# Patient Record
Sex: Female | Born: 1960 | Race: White | Hispanic: No | Marital: Married | Smoking: Former smoker
Health system: Southern US, Community
[De-identification: ages and names within clinical notes are randomized; demographics above are authoritative.]

## PROBLEM LIST (undated history)

## (undated) DIAGNOSIS — G2581 Restless legs syndrome: Secondary | ICD-10-CM

## (undated) DIAGNOSIS — E669 Obesity, unspecified: Secondary | ICD-10-CM

## (undated) DIAGNOSIS — D689 Coagulation defect, unspecified: Secondary | ICD-10-CM

## (undated) DIAGNOSIS — K648 Other hemorrhoids: Secondary | ICD-10-CM

## (undated) DIAGNOSIS — K5790 Diverticulosis of intestine, part unspecified, without perforation or abscess without bleeding: Secondary | ICD-10-CM

## (undated) DIAGNOSIS — I10 Essential (primary) hypertension: Secondary | ICD-10-CM

## (undated) DIAGNOSIS — F418 Other specified anxiety disorders: Secondary | ICD-10-CM

## (undated) DIAGNOSIS — K449 Diaphragmatic hernia without obstruction or gangrene: Secondary | ICD-10-CM

## (undated) DIAGNOSIS — K219 Gastro-esophageal reflux disease without esophagitis: Secondary | ICD-10-CM

## (undated) DIAGNOSIS — D649 Anemia, unspecified: Secondary | ICD-10-CM

## (undated) DIAGNOSIS — E785 Hyperlipidemia, unspecified: Secondary | ICD-10-CM

## (undated) DIAGNOSIS — F17200 Nicotine dependence, unspecified, uncomplicated: Secondary | ICD-10-CM

## (undated) HISTORY — DX: Coagulation defect, unspecified: D68.9

## (undated) HISTORY — DX: Diverticulosis of intestine, part unspecified, without perforation or abscess without bleeding: K57.90

## (undated) HISTORY — DX: Obesity, unspecified: E66.9

## (undated) HISTORY — PX: CARDIOVASCULAR STRESS TEST: SHX262

## (undated) HISTORY — DX: Nicotine dependence, unspecified, uncomplicated: F17.200

## (undated) HISTORY — DX: Gastro-esophageal reflux disease without esophagitis: K21.9

## (undated) HISTORY — DX: Hyperlipidemia, unspecified: E78.5

## (undated) HISTORY — DX: Other specified anxiety disorders: F41.8

## (undated) HISTORY — DX: Restless legs syndrome: G25.81

## (undated) HISTORY — DX: Anemia, unspecified: D64.9

## (undated) HISTORY — DX: Essential (primary) hypertension: I10

## (undated) HISTORY — DX: Other hemorrhoids: K64.8

## (undated) HISTORY — DX: Diaphragmatic hernia without obstruction or gangrene: K44.9

---

## 1985-03-29 HISTORY — PX: CHOLECYSTECTOMY: SHX55

## 1988-03-29 HISTORY — PX: PARTIAL HYSTERECTOMY: SHX80

## 1998-02-26 DIAGNOSIS — J40 Bronchitis, not specified as acute or chronic: Secondary | ICD-10-CM | POA: Insufficient documentation

## 1999-05-27 ENCOUNTER — Encounter: Payer: Self-pay | Admitting: Family Medicine

## 1999-05-27 ENCOUNTER — Encounter: Admission: RE | Admit: 1999-05-27 | Discharge: 1999-05-27 | Payer: Self-pay | Admitting: Family Medicine

## 1999-07-28 DIAGNOSIS — L509 Urticaria, unspecified: Secondary | ICD-10-CM | POA: Insufficient documentation

## 1999-11-04 ENCOUNTER — Encounter: Admission: RE | Admit: 1999-11-04 | Discharge: 1999-11-04 | Payer: Self-pay | Admitting: Family Medicine

## 1999-11-04 ENCOUNTER — Encounter: Payer: Self-pay | Admitting: Family Medicine

## 2001-01-27 DIAGNOSIS — N63 Unspecified lump in unspecified breast: Secondary | ICD-10-CM | POA: Insufficient documentation

## 2002-03-08 ENCOUNTER — Encounter: Admission: RE | Admit: 2002-03-08 | Discharge: 2002-03-08 | Payer: Self-pay | Admitting: Family Medicine

## 2002-03-08 ENCOUNTER — Encounter: Payer: Self-pay | Admitting: Family Medicine

## 2002-08-14 ENCOUNTER — Encounter: Payer: Self-pay | Admitting: Family Medicine

## 2002-08-14 ENCOUNTER — Encounter: Admission: RE | Admit: 2002-08-14 | Discharge: 2002-08-14 | Payer: Self-pay | Admitting: Family Medicine

## 2002-11-09 ENCOUNTER — Ambulatory Visit (HOSPITAL_BASED_OUTPATIENT_CLINIC_OR_DEPARTMENT_OTHER): Admission: RE | Admit: 2002-11-09 | Discharge: 2002-11-09 | Payer: Self-pay | Admitting: Orthopedic Surgery

## 2003-01-18 ENCOUNTER — Ambulatory Visit (HOSPITAL_BASED_OUTPATIENT_CLINIC_OR_DEPARTMENT_OTHER): Admission: RE | Admit: 2003-01-18 | Discharge: 2003-01-18 | Payer: Self-pay | Admitting: Orthopedic Surgery

## 2003-01-18 ENCOUNTER — Ambulatory Visit (HOSPITAL_COMMUNITY): Admission: RE | Admit: 2003-01-18 | Discharge: 2003-01-18 | Payer: Self-pay | Admitting: Orthopedic Surgery

## 2004-01-07 ENCOUNTER — Encounter: Admission: RE | Admit: 2004-01-07 | Discharge: 2004-01-07 | Payer: Self-pay | Admitting: Family Medicine

## 2004-09-25 ENCOUNTER — Other Ambulatory Visit: Admission: RE | Admit: 2004-09-25 | Discharge: 2004-09-25 | Payer: Self-pay | Admitting: Family Medicine

## 2005-02-09 ENCOUNTER — Encounter: Admission: RE | Admit: 2005-02-09 | Discharge: 2005-02-09 | Payer: Self-pay | Admitting: Family Medicine

## 2005-02-10 ENCOUNTER — Encounter: Admission: RE | Admit: 2005-02-10 | Discharge: 2005-02-10 | Payer: Self-pay | Admitting: Family Medicine

## 2005-04-09 ENCOUNTER — Encounter: Admission: RE | Admit: 2005-04-09 | Discharge: 2005-04-09 | Payer: Self-pay | Admitting: Family Medicine

## 2005-04-09 LAB — HM MAMMOGRAPHY: HM Mammogram: NEGATIVE

## 2006-01-04 ENCOUNTER — Ambulatory Visit: Payer: Self-pay | Admitting: Family Medicine

## 2006-04-13 ENCOUNTER — Ambulatory Visit: Payer: Self-pay | Admitting: Family Medicine

## 2006-08-09 ENCOUNTER — Ambulatory Visit: Payer: Self-pay | Admitting: Family Medicine

## 2006-08-29 ENCOUNTER — Ambulatory Visit: Payer: Self-pay | Admitting: Family Medicine

## 2006-12-07 ENCOUNTER — Ambulatory Visit: Payer: Self-pay | Admitting: Family Medicine

## 2007-01-03 ENCOUNTER — Ambulatory Visit: Payer: Self-pay | Admitting: Family Medicine

## 2007-06-12 ENCOUNTER — Ambulatory Visit: Payer: Self-pay | Admitting: Family Medicine

## 2007-08-07 ENCOUNTER — Ambulatory Visit: Payer: Self-pay | Admitting: Family Medicine

## 2007-10-03 ENCOUNTER — Ambulatory Visit: Payer: Self-pay | Admitting: Family Medicine

## 2007-11-27 ENCOUNTER — Ambulatory Visit: Payer: Self-pay | Admitting: Family Medicine

## 2008-01-10 ENCOUNTER — Ambulatory Visit: Payer: Self-pay | Admitting: Family Medicine

## 2008-03-29 HISTORY — PX: OTHER SURGICAL HISTORY: SHX169

## 2008-04-16 ENCOUNTER — Ambulatory Visit (HOSPITAL_COMMUNITY): Admission: RE | Admit: 2008-04-16 | Discharge: 2008-04-17 | Payer: Self-pay | Admitting: Urology

## 2008-07-04 ENCOUNTER — Ambulatory Visit: Payer: Self-pay | Admitting: Family Medicine

## 2008-07-25 ENCOUNTER — Ambulatory Visit: Payer: Self-pay | Admitting: Family Medicine

## 2008-10-04 ENCOUNTER — Other Ambulatory Visit: Admission: RE | Admit: 2008-10-04 | Discharge: 2008-10-04 | Payer: Self-pay | Admitting: Family Medicine

## 2008-10-04 ENCOUNTER — Ambulatory Visit: Payer: Self-pay | Admitting: Family Medicine

## 2009-02-28 ENCOUNTER — Ambulatory Visit: Payer: Self-pay | Admitting: Family Medicine

## 2009-03-14 ENCOUNTER — Encounter: Admission: RE | Admit: 2009-03-14 | Discharge: 2009-03-14 | Payer: Self-pay | Admitting: Family Medicine

## 2009-08-05 ENCOUNTER — Ambulatory Visit: Payer: Self-pay | Admitting: Family Medicine

## 2010-04-20 ENCOUNTER — Ambulatory Visit: Admit: 2010-04-20 | Payer: Self-pay | Admitting: Family Medicine

## 2010-06-01 ENCOUNTER — Other Ambulatory Visit (INDEPENDENT_AMBULATORY_CARE_PROVIDER_SITE_OTHER): Payer: PRIVATE HEALTH INSURANCE | Admitting: Family Medicine

## 2010-06-01 DIAGNOSIS — G5600 Carpal tunnel syndrome, unspecified upper limb: Secondary | ICD-10-CM

## 2010-06-01 DIAGNOSIS — J40 Bronchitis, not specified as acute or chronic: Secondary | ICD-10-CM

## 2010-06-01 DIAGNOSIS — E7800 Pure hypercholesterolemia, unspecified: Secondary | ICD-10-CM

## 2010-06-01 DIAGNOSIS — J069 Acute upper respiratory infection, unspecified: Secondary | ICD-10-CM

## 2010-06-01 DIAGNOSIS — E119 Type 2 diabetes mellitus without complications: Secondary | ICD-10-CM

## 2010-06-01 DIAGNOSIS — I10 Essential (primary) hypertension: Secondary | ICD-10-CM

## 2010-06-01 DIAGNOSIS — G479 Sleep disorder, unspecified: Secondary | ICD-10-CM

## 2010-06-01 DIAGNOSIS — F411 Generalized anxiety disorder: Secondary | ICD-10-CM

## 2010-06-01 DIAGNOSIS — N63 Unspecified lump in unspecified breast: Secondary | ICD-10-CM

## 2010-06-01 DIAGNOSIS — Z889 Allergy status to unspecified drugs, medicaments and biological substances status: Secondary | ICD-10-CM

## 2010-06-06 ENCOUNTER — Encounter: Payer: Self-pay | Admitting: Family Medicine

## 2010-06-18 ENCOUNTER — Ambulatory Visit (INDEPENDENT_AMBULATORY_CARE_PROVIDER_SITE_OTHER): Payer: PRIVATE HEALTH INSURANCE | Admitting: Family Medicine

## 2010-06-18 DIAGNOSIS — I959 Hypotension, unspecified: Secondary | ICD-10-CM

## 2010-06-18 DIAGNOSIS — R079 Chest pain, unspecified: Secondary | ICD-10-CM

## 2010-06-22 ENCOUNTER — Ambulatory Visit (INDEPENDENT_AMBULATORY_CARE_PROVIDER_SITE_OTHER): Payer: PRIVATE HEALTH INSURANCE

## 2010-06-22 DIAGNOSIS — Z79899 Other long term (current) drug therapy: Secondary | ICD-10-CM

## 2010-07-13 LAB — CBC
HCT: 35.3 % — ABNORMAL LOW (ref 36.0–46.0)
HCT: 41.4 % (ref 36.0–46.0)
Hemoglobin: 11.6 g/dL — ABNORMAL LOW (ref 12.0–15.0)
Hemoglobin: 12.4 g/dL (ref 12.0–15.0)
Hemoglobin: 13.8 g/dL (ref 12.0–15.0)
MCHC: 33.3 g/dL (ref 30.0–36.0)
MCHC: 33.4 g/dL (ref 30.0–36.0)
MCV: 83 fL (ref 78.0–100.0)
Platelets: 223 10*3/uL (ref 150–400)
Platelets: 246 10*3/uL (ref 150–400)
RBC: 4.21 MIL/uL (ref 3.87–5.11)
RBC: 4.98 MIL/uL (ref 3.87–5.11)
RDW: 13.3 % (ref 11.5–15.5)
WBC: 4.7 10*3/uL (ref 4.0–10.5)
WBC: 4.9 10*3/uL (ref 4.0–10.5)

## 2010-07-13 LAB — TYPE AND SCREEN

## 2010-07-13 LAB — BASIC METABOLIC PANEL
Chloride: 101 mEq/L (ref 96–112)
GFR calc non Af Amer: >60 mL/min (ref >60)
Sodium: 141 mEq/L (ref 135–145)

## 2010-07-13 LAB — GLUCOSE, CAPILLARY
Glucose-Capillary: 120 mg/dL — ABNORMAL HIGH (ref 70–99)
Glucose-Capillary: 127 mg/dL — ABNORMAL HIGH (ref 70–99)
Glucose-Capillary: 135 mg/dL — ABNORMAL HIGH (ref 70–99)

## 2010-07-13 LAB — ABO/RH: ABO/RH(D): O POS

## 2010-07-18 ENCOUNTER — Encounter: Payer: Self-pay | Admitting: Family Medicine

## 2010-08-07 ENCOUNTER — Other Ambulatory Visit: Payer: PRIVATE HEALTH INSURANCE

## 2010-08-07 DIAGNOSIS — Z79899 Other long term (current) drug therapy: Secondary | ICD-10-CM

## 2010-08-07 DIAGNOSIS — E785 Hyperlipidemia, unspecified: Secondary | ICD-10-CM

## 2010-08-07 LAB — LIPID PANEL: Cholesterol: 169 mg/dL (ref 0–200)

## 2010-08-07 LAB — COMPREHENSIVE METABOLIC PANEL
AST: 14 U/L (ref 0–37)
Albumin: 4.1 g/dL (ref 3.5–5.2)
Alkaline Phosphatase: 53 U/L (ref 39–117)
Calcium: 8.9 mg/dL (ref 8.4–10.5)
Creat: 0.56 mg/dL (ref 0.40–1.20)
Glucose, Bld: 89 mg/dL (ref 70–99)
Potassium: 4.4 mEq/L (ref 3.5–5.3)
Total Bilirubin: 0.4 mg/dL (ref 0.3–1.2)

## 2010-08-10 ENCOUNTER — Ambulatory Visit (INDEPENDENT_AMBULATORY_CARE_PROVIDER_SITE_OTHER): Payer: PRIVATE HEALTH INSURANCE | Admitting: Family Medicine

## 2010-08-10 ENCOUNTER — Encounter: Payer: Self-pay | Admitting: Family Medicine

## 2010-08-10 VITALS — BP 120/82 | HR 72 | Ht 62.0 in | Wt 171.0 lb

## 2010-08-10 DIAGNOSIS — I10 Essential (primary) hypertension: Secondary | ICD-10-CM

## 2010-08-10 DIAGNOSIS — E119 Type 2 diabetes mellitus without complications: Secondary | ICD-10-CM

## 2010-08-10 DIAGNOSIS — F411 Generalized anxiety disorder: Secondary | ICD-10-CM

## 2010-08-10 DIAGNOSIS — E7800 Pure hypercholesterolemia, unspecified: Secondary | ICD-10-CM

## 2010-08-10 DIAGNOSIS — K219 Gastro-esophageal reflux disease without esophagitis: Secondary | ICD-10-CM

## 2010-08-10 MED ORDER — PRAVASTATIN SODIUM 80 MG PO TABS: 80.0000 mg | ORAL_TABLET | Freq: Every day | ORAL | Status: DC

## 2010-08-10 MED ORDER — FLUOXETINE HCL 40 MG PO CAPS: 40.0000 mg | ORAL_CAPSULE | Freq: Every day | ORAL | Status: DC

## 2010-08-10 MED ORDER — METFORMIN HCL 500 MG PO TABS: 500.0000 mg | ORAL_TABLET | Freq: Two times a day (BID) | ORAL | Status: DC

## 2010-08-10 NOTE — Patient Instructions (Signed)
Please try and exercise at least 30 minutes daily.  Try and follow a healthy diet--no skipping meals, lots of vegetables, avoiding sweets and fatty foods, low sugar diet

## 2010-08-10 NOTE — Progress Notes (Signed)
Subjective:    Patient ID: Natasha Owens, female    DOB: 1960-06-04, 50 y.o.   MRN: [id number]  HPI Hypertension follow-up:  Blood pressures are not checked.  Has been cutting her 20mg  lisinopril into 1/4 and feeling just fine.  Denies dizziness, headaches, chest pain.  Denies side effects of medications.  Diabetes follow-up:  Blood sugars are not being checked.  Out of test strips.  Denies hypoglycemia.  Denies polydipsia and polyuria.  Last eye exam was 2 weeks ago.  Patient follows a low sugar diet and checks feet regularly without concerns.  Not getting any exercise and not being exceptionally careful with her diet.  Hyperlipidemia follow-up:  Patient isn't really watching her diet carefully.  Is on some sort of diet pill and has been losing weight.  She was changed from Crestor to Pravastatin due to cost purposes.  Compliant with medications and denies medication side effects.  Had labs done last week  GERD:  Taking the OTC Prevacid BID with good results. Denies any reflux symptoms lately.  Depression/Anxiety f/u:  Has been on 40mg  of Prozac.  Previously took Symbyax but it was too expensive.  Feels like it working well for anxiety, and perhaps too well for depression.  Feels like she can't cry--lost a patient recently.  Was sad, but unable to cry.  Father was recently diagnosed with lung cancer.  Her last patient recently passed away.  She just quit her job and plans to move up to [place] to help out her dad.  Plans to move Friday.  Will be unable to return for follow-up office visit anytime soon.  Past Medical History  Diagnosis Date  . Hypertension   . Smoker     quit 11/1998  . Obesity   . GERD (gastroesophageal reflux disease)   . Anxiety associated with depression   . Dyslipidemia   . Diabetes mellitus 9/05    adult onset    Past Surgical History  Procedure Date  . Partial hysterectomy 1990  . Cholecystectomy 1987  . Cardiovascular stress test   . Bladder surgery 03/2008   bladder tack--unsuccessful per pt     History   Social History  . Marital Status: Married    Spouse Name: N/A    Number of Children: 2  . Years of Education: N/A   Occupational History  . CNA     at [employer]   Social History Main Topics  . Smoking status: Former Smoker    Quit date: 02/26/2009  . Smokeless tobacco: Not on file  . Alcohol Use: Yes     rarely, 2 times a year maybe  . Drug Use: No  . Sexually Active: Not on file   Other Topics Concern  . Not on file   Social History Narrative  . No narrative on file    Family History  Problem Relation Age of Onset  . Diabetes Mother   . Heart disease Father     CHF  . Cancer Father     lymphoma, lung cancer   . Diabetes Sister   . Stroke Sister   . Dementia Sister   . Hypertension Brother   . Sleep apnea Brother   . Sleep apnea Brother     Current outpatient prescriptions:FLUoxetine (PROZAC) 40 MG capsule, Take 1 capsule (40 mg total) by mouth daily., Disp: 90 capsule, Rfl: 3;  lansoprazole (PREVACID) 15 MG capsule, Take 15 mg by mouth 2 (two) times daily.  , Disp: , Rfl: ;  lisinopril (PRINIVIL,ZESTRIL) 20 MG tablet, Take 5 mg by mouth daily. 1/4 tablet daily, Disp: , Rfl: ;  magnesium gluconate (MAGONATE) 500 MG tablet, Take 500 mg by mouth daily.  , Disp: , Rfl:  metFORMIN (GLUCOPHAGE) 500 MG tablet, Take 1 tablet (500 mg total) by mouth 2 (two) times daily with a meal., Disp: 180 tablet, Rfl: 3;  Potassium 99 MG TABS, Take 1 tablet by mouth daily.  , Disp: , Rfl: ;  pravastatin (PRAVACHOL) 80 MG tablet, Take 1 tablet (80 mg total) by mouth daily., Disp: 90 tablet, Rfl: 3;  DISCONTD: FLUoxetine (PROZAC) 40 MG capsule, Take 40 mg by mouth daily.  , Disp: , Rfl:  DISCONTD: lansoprazole (PREVACID) 30 MG capsule, Take 30 mg by mouth 2 (two) times daily. , Disp: , Rfl: ;  DISCONTD: metFORMIN (GLUCOPHAGE) 500 MG tablet, Take 500 mg by mouth 2 (two) times daily with a meal.  , Disp: , Rfl: ;  DISCONTD: pravastatin  (PRAVACHOL) 80 MG tablet, Take 80 mg by mouth daily.  , Disp: , Rfl: ;  DISCONTD: esomeprazole (NEXIUM) 40 MG capsule, Take 40 mg by mouth daily before breakfast.  , Disp: , Rfl:  DISCONTD: olanzapine-fluoxetine (SYMBYAX) 12-50 MG per capsule, Take 1 capsule by mouth every evening.  , Disp: , Rfl: ;  DISCONTD: olmesartan (BENICAR) 20 MG tablet, Take 20 mg by mouth daily.  , Disp: , Rfl: ;  DISCONTD: rosuvastatin (CRESTOR) 10 MG tablet, Take 10 mg by mouth daily.  , Disp: , Rfl:   Allergies  Allergen Reactions  . Codeine Other (See Comments)    spacey  . Sulfa Antibiotics Itching   Review of Systems Denies fevers, n/v/d, chest pain, palpitations, SOB, cough, URI symptoms, bladder concerns, skin concerns or rashes.    Objective:   Physical Exam BP 120/82  Pulse 72  Ht 5\' 2"  (1.575 m)  Wt 171 lb (77.565 kg)  BMI 31.28 kg/m2  LMP 03/29/1989 WDWN pleasant female in no distress Neck:  No LAD or thyromegaly Heart:  RRR no m/r/g Lungs:  CTA bilaterally Extr:  No CCE, 2+ pulses, normal sensation Abd:  Soft, Nontender, no organomegaly or masses Psych: full range of affect; normal mood, speech, affect, hygiene, grooming, good eye contact     Assessment & Plan:   1. Type II or unspecified type diabetes mellitus without mention of complication, not stated as uncontrolled  metFORMIN (GLUCOPHAGE) 500 MG tablet   Patient needs to re-start checking sugars (especially if will be unable to return for A1c in 4-6 months).  Cont proper diet and weight loss  2. Essential hypertension, benign     Well controlled on 5mg  Lisinopril.  Will call for change in Rx to lower strength when she needs it  3. Anxiety state, unspecified  FLUoxetine (PROZAC) 40 MG capsule   Overall is controlled.  The decreased ability to cry is currently tolerable.  If becomes a problem, she can decrease dose (ie. qod dosing or call for 20mg )  4. Pure hypercholesterolemia  pravastatin (PRAVACHOL) 80 MG tablet   Continue low  cholesterol diet and pravastatin.  Labs due in 6 months.  If out of town, okay to check 1 year  5. GERD (gastroesophageal reflux disease)

## 2010-08-11 NOTE — Op Note (Signed)
Natasha, Owens NO.:  [id number]   MEDICAL RECORD NO.:  [id number]          PATIENT TYPE:  AMB   LOCATION:  DAY                          FACILITY:  [facility]   PHYSICIAN:  Martina Sinner, MD DATE OF BIRTH:  1960-11-27   DATE OF PROCEDURE:  04/16/2008  DATE OF DISCHARGE:                               OPERATIVE REPORT   ASSISTANT:  Duane Boston and Delia Chimes.   DIAGNOSES:  Cystocele, stress incontinence and rectocele.   POSTOPERATIVE DIAGNOSES:  Cystocele, rectocele, apical defect, stress  incontinence.   SURGERY:  Sling cystourethropexy [facility]) plus cystocele repair,  iliococcygeus posterior vault suspension plus rectocele repair plus  graft plus cystoscopy plus perineal repair.   Ms. Natasha Owens has a significant posterior defect.  She has a mild to  moderate cystocele.  She has very mild stress incontinence.  She  consented to above procedure.  Preoperative lab tests were normal.  Extra care was taken with leg position to minimize the risk of  compartment syndrome, neuropathy and DVT.  Preoperative antibiotics was  given.   Under anesthesia the apex as well supported.  I could easily see the  dimples.  There was no question that the patient did not require a four  corner anterior graft.   I instilled approximately 25 mL of lidocaine epinephrine mixture  anteriorly and made a T-shaped anterior vaginal wall incision and  dissected the anterior vaginal wall from the pubocervical fascia  laterally.  I did not take down the lateral attachments.  I felt this  would weaken her tissues.  I made the incision in the midline a little  bit deeper underlying the urethra that would overlie the sling.   I used a double ring retractor.  I made two 1 cm incisions one  fingerbreadth above the symphysis pubis lateral to the midline.  I  dissected to urethrovesical angle at the level of symphysis pubis  bilaterally.   With the bladder empty I passed the [facility]  needles on top of along the  back of symphysis pubis parallel to the midline on the pulp of my index  finger bilaterally at the urethrovesical angle.  I cystoscoped the  patient.  There is no entrance of the bladder or urethra.  Cystoscopy  showed no indentation of the bladder with wiggling of the needles.   Then a two-layer anterior repair was done and I did a lot of  mobilization at the apex of the cystocele.  It flattened the cystocele  nicely.  I then recystoscoped the patient and was endoscopic reduction  of the cystocele.  There was no distortion of the ureteral orifices.  There were excellent jets, blue jets bilaterally.   Minimal anterior vaginal wall mucosa was trimmed bilaterally and I  closed the anterior vaginal wall with running 2-0 Vicryl or 0 Vicryl on  a CT1 needle.   She had a very large posterior defect.  It protruded and touched the  anterior wall throughout the length of the vagina but clinically I did  not think there was an enterocele.   I removed a small  triangle of perineal skin after placing an Allis clamp  on the left and right of the hymenal ring.  I made a long posterior  midline incision all the way to the vaginal cuff.  I dissected the thin  posterior vaginal wall from the underlying rectovaginal fascia and was  very happy with how well I mobilized apically and laterally and apically  as well.  I finger dissected through the pararectal fascial tissue and  could easily identify the iliococcygeus muscle cephalad and lateral to  the rectum.   With the finger in the rectum, I did a lot of inspection of her  posterior defect.  Initially I thought there was an enterocele.  I did  my usual techniques of using hemostats and opened up what looked like an  enterocele sac.  It became quite obvious that it was not an enterocele  and I had only opened up approximately one cell layer of a fascial  plane.   Again a lot of inspection was done and there was really not  a easy way  to the trapdoor effect in spite of a lot of mobilization to cover the  large apical and lateral defects.  One could not do an imbricating  repair posteriorly of any subsequence because of the size of the  defects.   I did two site defects with the thin rectovaginal fascia from her left  apical apex to bring the tissue medial to her apex in the midline and  pushing the pararectal fat posteriorly.  I then imbricated a little bit  of the pararectal fascial tissue and bowel serosa on her right side  reducing a defect on her right side.  Visually the repair looked  excellent with reduction of the posterior defect but certainly the  tissue was very thin.  I did a repeat rectal examination.  There was no  distortion of the rectum.  There was no suture in the bladder.   Using my narrow malleable, I placed an iliococcygeus 0 Vicryl cephalad  and posteriorly lateral to the rectum bilaterally.  It was a strong  suture.  Two more sutures were placed near the introitus picking up  mostly rectovaginal fascia.  The 7 x 4 soaked dermal graft was sewn in  place with a trap door at the apex.  We trimmed it near the introitus to  make it a little bit narrow.  I did use two sutures halfway down, a  total of six sutures for extra strength.   Appropriate amount of  vaginal mucosa was trimmed bilaterally with  approximately a little bit over a centimeter especially in the midline.  The posterior vaginal wall was closed with running 2-0 Vicryl on a CT1  needle.  At the level of the introitus I brought the suture into the  perineum and closed subcuticular running.  I did one perineal body 0  Vicryl suture.  The graft did trapdoor nicely at the apex.   There was excellent vaginal length.  There was excellent girth.  Bleeding was minimal and probably 150 mL or less.  She was oozing a  little bit anteriorly at the end of the closure, but I thought was  probably from the closure itself.  There is a  little bit of bubbling of  blood at the end as we packed anteriorly but it was minimal.  There was  almost no bleeding posteriorly.  No fulguration was used next to the  bowel.   Estrace vaginal pack  was firmly inserted.  Foley catheter had excellent  urine output of blue urine.  Leg position was good.   The patient will be followed as per protocol.  Hopefully this reaches  her treatment goal.  I am concerned about the size of her defect and  thinning of her posterior tissues.           ______________________________  Martina Sinner, MD  Electronically Signed     SAM/MEDQ  D:  04/16/2008  T:  04/16/2008  Job:  [id number]

## 2010-08-14 NOTE — Op Note (Signed)
NAME:  Natasha Owens, Natasha Owens                         ACCOUNT NO.:  [id number]   MEDICAL RECORD NO.:  [id number]                   PATIENT TYPE:  AMB   LOCATION:  DSC                                  FACILITY:  [facility]   PHYSICIAN:  Harvie Junior, M.D.                DATE OF BIRTH:  1960/04/10   DATE OF PROCEDURE:  01/18/2003  DATE OF DISCHARGE:                                 OPERATIVE REPORT   PREOPERATIVE DIAGNOSIS:  1. Ulnar nerve compression left arm.  2. Carpal tunnel syndrome, left arm.   POSTOPERATIVE DIAGNOSES:  1. Ulnar nerve compression left arm.  2. Carpal tunnel syndrome, left arm.   PROCEDURE:  1. Left ulnar decompression at the elbow.  2. Left carpal tunnel release.   SURGEON:  Harvie Junior, M.D.   ASSISTANTLenise Arena   ANESTHESIA:  General.   BRIEF HISTORY:  He is a 50 year old female with a long history of having had  bilateral nerve compression at the elbow and wrist.  She ultimately  underwent a right ulnar nerve decompression and carpal tunnel release and  tolerated this well; and did wonderfully postoperatively.  Because of  continued complaints of problems on the left side, she presented for this  same problem on the left side.   DESCRIPTION OF PROCEDURE:  The patient was brought to the operating room and  when after adequate anesthesia was obtained with a general anesthetic the  patient was placed supine on the operating table. The left arm was prepped  and draped in the usual sterile fashion.  Following Esmarch exsanguination  with tourniquet the blood pressure tourniquet was inflated to 250 mmHg.   Following this a curved incision was made over the midportion between the  medial epicondyle and the olecranon.  The subcutaneous tissue was taken down  to the level of the ulnar nerve.  The ulnar nerve was clearly identified and  the sheath of the ulnar nerve was opened proximally 8 cm proximal to the  medial epicondyle and distally 10 cm distal to the  elbow.  The nerve was  freed up over its entire length.  It was not transposed.  There was scar  tissue just distal to the medial epicondyle and the nerve did seem to be  most irritated at this point.   At this point, the nerve was copiously irrigated and suctioned dry.  Silver  retractors were used proximally and distally to make sure that the nerve was  adequately freed up over this length; and, in fact, it was.  At this point  the wound was closed with a combination of 3-0 Vicryl and 3-0 Monocryl pull  out sutures.  Benzoin and Steri-Strips were applied.  Marcaine 5 cc were  placed into the wound for postoperative anesthesia.   At this point attention was turned to the left hand where a curved incision  was made just ulnar to  the midline wrist crease.  The subcutaneous tissue  was taken down to the level of the volar carpal ligament which was clearly  identified and divided and care was taken to identify the median nerve  below. A Freer elevator was used to make sure the nerve was not adherent to  the ligament underneath and then a curved scissor was used to divide the  ligament proximally and distally.  A gloved finger could be put in the wound  proximally and distally at this point.  The median nerve was identified and  a small synovectomy was undertaken of the flexor tendon.  At this point the  wound was copiously irrigated and suctioned dry and closed with a  combination of interrupted and running 4-0 Vicryl sutures.  A sterile and  compression dressing was applied; and the patient was taken to the recovery  room and was noted to be in satisfactory condition.   ESTIMATED BLOOD LOSS FOR BOTH:  None.                                               Harvie Junior, M.D.    Ranae Plumber  D:  01/18/2003  T:  01/18/2003  Job:  [id number]

## 2010-08-14 NOTE — Op Note (Signed)
NAME:  Natasha Owens, Natasha Owens                         ACCOUNT NO.:  [id number]   MEDICAL RECORD NO.:  [id number]                   PATIENT TYPE:  AMB   LOCATION:  DSC                                  FACILITY:  [facility]   PHYSICIAN:  Harvie Junior, M.D.                DATE OF BIRTH:  06-22-1960   DATE OF PROCEDURE:  12/13/2002  DATE OF DISCHARGE:  11/09/2002                                 OPERATIVE REPORT   PREOPERATIVE DIAGNOSIS:  1. Carpal tunnel syndrome, right.  2. Ulnar nerve compression at the elbow, right.   POSTOPERATIVE DIAGNOSIS:  1. Carpal tunnel syndrome, right.  2. Ulnar nerve compression at the elbow, right.   OPERATION PERFORMED:  1. Right ulnar nerve decompression at the elbow.  2. Right carpal tunnel release.   SURGEON:  Harvie Junior, M.D.   ASSISTANT:  Marshia Ly, P.A.   ANESTHESIA:  General.   INDICATIONS FOR PROCEDURE:  The patient is a 50 year old female with along  history of having significant compression of her median nerve at the wrist  and her ulnar nerve at the elbow.  We tried conservative care which failed  and because of failure of conservative care, she was brought to the  operating room for release of these areas.   DESCRIPTION OF PROCEDURE:  The patient was brought to the operating room and  after adequate anesthesia was obtained with general anesthetic, the patient  was placed supine on the operating table.  The right arm was prepped and  draped in the usual sterile fashion.  Following Esmarch exsanguination of  the extremity, blood pressure tourniquet was inflated to .  Following  this, a curved incision was made over the right elbow.  Subcutaneous tissue  was dissected down to the level of the ulnar nerve which could clearly be  identified and decompressed for 10 cm proximal to the elbow to 7 cm distal  to the elbow.  The major area of compression appeared to be just distal to  the ulnar groove.  No attempt at transposition was  undertaken.  The wound  was copiously irrigated and suctioned dry.  A final check was made to make  sure the nerve was completely freed up and no tendency towards subluxation  with elbow flexion.  The skin was then closed with a combination of 2-0  Vicryl and a 3-0 Prolene pullout suture.  A sterile compressive dressing was  applied and attention was turned to the wrist, where a curved incision was  made just ulnar to the midline wrist crease.  Subcutaneous tissues were  dissected down to the level of the volar carpal ligament which was clearly  identified and divided.  A Freer elevator was used and through this, a small  rent in the ligament to make sure the nerve was not adherent below this and  the ligament was divided proximally and distally.  A gloved finger  could be  placed into the wound assuring that the nerve was freed up.  The nerve was  then identified as well as the motor branch of the median nerve and once  this was clearly identified, the wound was copiously irrigated and suctioned  dry, the wound was closed with a combination of interrupted and  running 4-0 nylon sutures.  Sterile compressive dressing was applied as well  as a volar plaster and the patient was then transferred to the recovery room  where she was noted to be in satisfactory condition.  The estimated blood  loss for this procedure was none.                                                Harvie Junior, M.D.    Ranae Plumber  D:  12/13/2002  T:  12/14/2002  Job:  [id number]

## 2010-08-26 ENCOUNTER — Other Ambulatory Visit: Payer: Self-pay | Admitting: *Deleted

## 2010-08-26 DIAGNOSIS — E7800 Pure hypercholesterolemia, unspecified: Secondary | ICD-10-CM

## 2010-08-26 DIAGNOSIS — F411 Generalized anxiety disorder: Secondary | ICD-10-CM

## 2010-08-26 MED ORDER — PRAVASTATIN SODIUM 80 MG PO TABS: 80.0000 mg | ORAL_TABLET | Freq: Every day | ORAL | Status: DC

## 2010-08-26 MED ORDER — FLUOXETINE HCL 40 MG PO CAPS: 40.0000 mg | ORAL_CAPSULE | Freq: Every day | ORAL | Status: DC

## 2010-09-21 ENCOUNTER — Other Ambulatory Visit: Payer: Self-pay | Admitting: *Deleted

## 2010-09-21 DIAGNOSIS — E119 Type 2 diabetes mellitus without complications: Secondary | ICD-10-CM

## 2010-09-21 MED ORDER — METFORMIN HCL 500 MG PO TABS: 500.0000 mg | ORAL_TABLET | Freq: Two times a day (BID) | ORAL | Status: DC

## 2010-12-09 ENCOUNTER — Other Ambulatory Visit: Payer: Self-pay | Admitting: *Deleted

## 2010-12-09 DIAGNOSIS — F329 Major depressive disorder, single episode, unspecified: Secondary | ICD-10-CM

## 2010-12-09 NOTE — Telephone Encounter (Signed)
I recommend adding Wellbutrin, rather than increasing fluoxetine. I also recommend that she try and get counseling/see a therapist while she is up there.  The meds can sometimes take a month or more to help, and she would benefit from counseling now.  If she isn't doing well regarding her depression, she won't be taking good care of herself (diet, diabetes, etc.).  Consider getting a doctor up there, if she is going remain there much longer. I am putting in for the Wellbutrin SR at the lower dose.  This is less expensive than the XL, but needs to be taken twice daily.  Second dose shouldn't be taken too late (ideally before 5-6 pm) or can cause insomnia.  The XL has less insomnia, taken just once daily, in the morning, but is more expensive (I would do 150mg  of XL, vs 100mg  BID of SR)
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
## 2010-12-10 MED ORDER — BUPROPION HCL ER (SR) 100 MG PO TB12: 100.0000 mg | ORAL_TABLET | Freq: Two times a day (BID) | ORAL | Status: DC

## 2010-12-10 NOTE — Telephone Encounter (Signed)
Called in to pharmacy bupropion SR 100mg  #60 BID, [place] [phone].

## 2010-12-16 ENCOUNTER — Telehealth: Payer: Self-pay | Admitting: *Deleted

## 2010-12-16 NOTE — Telephone Encounter (Signed)
Spoke with patient's husband, he wanted to let Dr.Knapp know that Chesni is taking the bupropion SR 100mg  BID and is having very good results so far. Also wanted to let you know that Grier's father passed away this morning. Patient will remain in  [place] for a little while longer with her mother but will return to [facility] thereafter.

## 2010-12-16 NOTE — Telephone Encounter (Signed)
noted 

## 2011-02-10 ENCOUNTER — Telehealth: Payer: Self-pay | Admitting: *Deleted

## 2011-02-10 DIAGNOSIS — F329 Major depressive disorder, single episode, unspecified: Secondary | ICD-10-CM

## 2011-02-10 DIAGNOSIS — I10 Essential (primary) hypertension: Secondary | ICD-10-CM

## 2011-02-10 MED ORDER — BUPROPION HCL ER (SR) 100 MG PO TB12: 100.0000 mg | ORAL_TABLET | Freq: Two times a day (BID) | ORAL | Status: DC

## 2011-02-10 MED ORDER — LISINOPRIL 20 MG PO TABS: 10.0000 mg | ORAL_TABLET | Freq: Every day | ORAL | Status: DC

## 2011-02-10 NOTE — Telephone Encounter (Signed)
Called in to [facility] in [place]. Lisinopril 20mg  #30 1/2 daily with 1 rf and bupropion SR 100mg  BID with 1 rf. Patient notified and they will call and schedule appt for 2 months.

## 2011-02-10 NOTE — Telephone Encounter (Signed)
Ok to refill x 2 months 

## 2011-02-10 NOTE — Telephone Encounter (Signed)
Patient's husband [name] called to let you know that Oktober is back from [place], she needs refills on 1) lisinopril 20mg  once daily and 2) bupropion SR 100mg  BID. She doesn't have health insurance any longer and they are aware that she is due to come in but wanted to know if you would refill medication until after the holidays and she will come in then as they cannot afford it right now. Thanks.

## 2011-02-24 ENCOUNTER — Other Ambulatory Visit: Payer: Self-pay | Admitting: *Deleted

## 2011-02-24 DIAGNOSIS — F329 Major depressive disorder, single episode, unspecified: Secondary | ICD-10-CM

## 2011-02-24 MED ORDER — BUPROPION HCL ER (SR) 100 MG PO TB12: 100.0000 mg | ORAL_TABLET | Freq: Two times a day (BID) | ORAL | Status: DC

## 2011-03-30 DIAGNOSIS — D649 Anemia, unspecified: Secondary | ICD-10-CM

## 2011-03-30 HISTORY — DX: Anemia, unspecified: D64.9

## 2011-06-09 ENCOUNTER — Telehealth: Payer: Self-pay | Admitting: Family Medicine

## 2011-06-09 DIAGNOSIS — F411 Generalized anxiety disorder: Secondary | ICD-10-CM

## 2011-06-09 DIAGNOSIS — F329 Major depressive disorder, single episode, unspecified: Secondary | ICD-10-CM

## 2011-06-09 DIAGNOSIS — E7800 Pure hypercholesterolemia, unspecified: Secondary | ICD-10-CM

## 2011-06-09 DIAGNOSIS — I10 Essential (primary) hypertension: Secondary | ICD-10-CM

## 2011-06-09 MED ORDER — FLUOXETINE HCL 40 MG PO CAPS: 40.0000 mg | ORAL_CAPSULE | Freq: Every day | ORAL | Status: DC

## 2011-06-09 MED ORDER — BUPROPION HCL ER (SR) 100 MG PO TB12: 100.0000 mg | ORAL_TABLET | Freq: Two times a day (BID) | ORAL | Status: DC

## 2011-06-09 MED ORDER — PRAVASTATIN SODIUM 80 MG PO TABS: 80.0000 mg | ORAL_TABLET | Freq: Every day | ORAL | Status: DC

## 2011-06-09 MED ORDER — LISINOPRIL 20 MG PO TABS: 10.0000 mg | ORAL_TABLET | Freq: Every day | ORAL | Status: DC

## 2011-06-09 NOTE — Telephone Encounter (Signed)
Looks like appt is 4/8.  Okay to refill 30 day supply of meds

## 2011-06-09 NOTE — Telephone Encounter (Signed)
Pt has appt scheduled with you first of April but will be out of meds. Needs refills on wellbutrin sr, prozac, lisinopril and pravastatin. Pt uses [facility] [address] in [place].

## 2011-06-10 ENCOUNTER — Other Ambulatory Visit: Payer: Self-pay | Admitting: *Deleted

## 2011-07-01 ENCOUNTER — Encounter: Payer: Self-pay | Admitting: Internal Medicine

## 2011-07-05 ENCOUNTER — Encounter: Payer: Self-pay | Admitting: Family Medicine

## 2011-07-05 ENCOUNTER — Ambulatory Visit (INDEPENDENT_AMBULATORY_CARE_PROVIDER_SITE_OTHER): Payer: Self-pay | Admitting: Family Medicine

## 2011-07-05 VITALS — BP 122/78 | HR 72 | Ht 62.0 in | Wt 169.0 lb

## 2011-07-05 DIAGNOSIS — E7800 Pure hypercholesterolemia, unspecified: Secondary | ICD-10-CM

## 2011-07-05 DIAGNOSIS — I10 Essential (primary) hypertension: Secondary | ICD-10-CM

## 2011-07-05 DIAGNOSIS — G2581 Restless legs syndrome: Secondary | ICD-10-CM | POA: Insufficient documentation

## 2011-07-05 DIAGNOSIS — F329 Major depressive disorder, single episode, unspecified: Secondary | ICD-10-CM | POA: Insufficient documentation

## 2011-07-05 DIAGNOSIS — Z79899 Other long term (current) drug therapy: Secondary | ICD-10-CM

## 2011-07-05 DIAGNOSIS — R82998 Other abnormal findings in urine: Secondary | ICD-10-CM

## 2011-07-05 DIAGNOSIS — E119 Type 2 diabetes mellitus without complications: Secondary | ICD-10-CM

## 2011-07-05 DIAGNOSIS — N390 Urinary tract infection, site not specified: Secondary | ICD-10-CM

## 2011-07-05 DIAGNOSIS — R8290 Unspecified abnormal findings in urine: Secondary | ICD-10-CM

## 2011-07-05 DIAGNOSIS — F411 Generalized anxiety disorder: Secondary | ICD-10-CM

## 2011-07-05 LAB — COMPREHENSIVE METABOLIC PANEL
ALT: 13 U/L (ref 0–35)
AST: 15 U/L (ref 0–37)
Albumin: 4 g/dL (ref 3.5–5.2)
BUN: 14 mg/dL (ref 6–23)
CO2: 25 mEq/L (ref 19–32)
Calcium: 9.7 mg/dL (ref 8.4–10.5)
Chloride: 101 mEq/L (ref 96–112)
Potassium: 4.6 mEq/L (ref 3.5–5.3)

## 2011-07-05 LAB — LIPID PANEL
Cholesterol: 157 mg/dL (ref 0–200)
Total CHOL/HDL Ratio: 2.8 Ratio
Triglycerides: 165 mg/dL — ABNORMAL HIGH (ref <150)

## 2011-07-05 LAB — POCT URINALYSIS DIPSTICK
Bilirubin, UA: NEGATIVE
Ketones, UA: NEGATIVE
pH, UA: 7

## 2011-07-05 LAB — TSH: TSH: 1.057 u[IU]/mL (ref 0.350–4.500)

## 2011-07-05 LAB — CBC WITH DIFFERENTIAL/PLATELET
Eosinophils Relative: 2 % (ref 0–5)
Hemoglobin: 11 g/dL — ABNORMAL LOW (ref 12.0–15.0)
Lymphocytes Relative: 15 % (ref 12–46)
Lymphs Abs: 1.2 10*3/uL (ref 0.7–4.0)
MCH: 24.3 pg — ABNORMAL LOW (ref 26.0–34.0)
MCV: 78.3 fL (ref 78.0–100.0)
Monocytes Relative: 9 % (ref 3–12)
Platelets: 448 10*3/uL — ABNORMAL HIGH (ref 150–400)
RBC: 4.52 MIL/uL (ref 3.87–5.11)
WBC: 7.9 10*3/uL (ref 4.0–10.5)

## 2011-07-05 LAB — T4: T4, Total: 9.9 ug/dL (ref 5.0–12.5)

## 2011-07-05 MED ORDER — FLUOXETINE HCL 40 MG PO CAPS: 40.0000 mg | ORAL_CAPSULE | Freq: Every day | ORAL | Status: DC

## 2011-07-05 MED ORDER — METFORMIN HCL 500 MG PO TABS
500.0000 mg | ORAL_TABLET | Freq: Two times a day (BID) | ORAL | Status: DC
Start: 2011-07-05 — End: 2012-01-17

## 2011-07-05 MED ORDER — CIPROFLOXACIN HCL 250 MG PO TABS: 250.0000 mg | ORAL_TABLET | Freq: Two times a day (BID) | ORAL | Status: DC

## 2011-07-05 MED ORDER — BUPROPION HCL ER (SR) 100 MG PO TB12: 100.0000 mg | ORAL_TABLET | Freq: Two times a day (BID) | ORAL | Status: DC

## 2011-07-05 MED ORDER — LISINOPRIL 20 MG PO TABS: 10.0000 mg | ORAL_TABLET | Freq: Every day | ORAL | Status: DC

## 2011-07-05 NOTE — Patient Instructions (Signed)
We will notify you of your lab results when back. If your cholesterol is at goal, we will send refill to pharmacy.  If electrolytes are normal, then we will call in something for your restless legs and let you know.  Consider grief counseling.  Restless Legs Syndrome Restless legs syndrome is a movement disorder. It may also be called a sensori-motor disorder.  CAUSES  No one knows what specifically causes restless legs syndrome, but it tends to run in families. It is also more common in people with low iron, in pregnancy, in people who need dialysis, and those with nerve damage (neuropathy).Some medications may make restless legs syndrome worse.Those medications include drugs to treat high blood pressure, some heart conditions, nausea, colds, allergies, and depression. SYMPTOMS Symptoms include uncomfortable sensations in the legs. These leg sensations are worse during periods of inactivity or rest. They are also worse while sitting or lying down. Individuals that have the disorder describe sensations in the legs that feel like:  Pulling.   Drawing.   Crawling.   Worming.   Boring.   Tingling.   Pins and needles.   Prickling.   Pain.  The sensations are usually accompanied by an overwhelming urge to move the legs. Sudden muscle jerks may also occur. Movement provides temporary relief from the discomfort. In rare cases, the arms may also be affected. Symptoms may interfere with going to sleep (sleep onset insomnia). Restless legs syndrome may also be related to periodic limb movement disorder (PLMD). PLMD is another more common motor disorder. It also causes interrupted sleep. The symptoms from PLMD usually occur most often when you are awake. TREATMENT  Treatment for restless legs syndrome is symptomatic. This means that the symptoms are treated.   Massage and cold compresses may provide temporary relief.   Walk, stretch, or take a cold or hot bath.   Get regular exercise and a  good night's sleep.   Avoid caffeine, alcohol, nicotine, and medications that can make it worse.   Do activities that provide mental stimulation like discussions, needlework, and video games. These may be helpful if you are not able to walk or stretch.  Some medications are effective in relieving the symptoms. However, many of these medications have side effects. Ask your caregiver about medications that may help your symptoms. Correcting iron deficiency may improve symptoms for some patients. Document Released: 03/05/2002 Document Revised: 03/04/2011 Document Reviewed: 06/11/2010 [facility] Patient Information 2012 [place], [place].

## 2011-07-05 NOTE — Progress Notes (Signed)
Chief complaint:  Patient presents for fasting med check, with complaint of malodorous urine.  HPI: Complaining of restless legs--has had in past.  Worse now than it has ever been.  Keeps her up at night, finally stops at 6 am.  Per old chart, tried Requip in 2005-2006, but pt reports it wasn't beneficial. Interested in trying another medication for restless legs.  Complaining of odor to urine, along with urgency and frequency for about a month.  Denies fevers, abdominal pain or kidney pain.  Denies dysuria.  No h/o UTI's in the past.  Diabetes follow-up:  Blood sugars at home are running 120-130.  Denies hypoglycemia (very rare, relieved by eating).  Denies polydipsia and polyuria.  Last eye exam was 1 year ago.  Patient follows a low sugar diet; doesn't check feet regularly but denies concerns.  Hypertension follow-up:  Blood pressures elsewhere had been running 145/78 when she wasn't taking her BP meds. Back on meds for about a month, not checked her BP since.  Denies dizziness, headaches, chest pain.  Denies side effects of medications.  Hyperlipidemia follow-up:  Patient is reportedly following a low-fat, low cholesterol diet.  Compliant with medications and denies medication side effects.  Depression/anxiety:  Moods have overall been controlled, but admits to "having her moments"--cries when she thinks of her dad.    GERD--doing well on prevacid.  Has recurrent symptoms if she misses a dose. Denies dysphagia.  Past Medical History  Diagnosis Date  . Hypertension   . Smoker     quit 11/1998  . Obesity   . GERD (gastroesophageal reflux disease)   . Anxiety associated with depression   . Dyslipidemia   . Diabetes mellitus 9/05    adult onset    Past Surgical History  Procedure Date  . Partial hysterectomy 1990  . Cholecystectomy 1987  . Cardiovascular stress test   . Bladder surgery 03/2008    bladder tack--unsuccessful per pt     History   Social History  . Marital Status:  Married    Spouse Name: N/A    Number of Children: 2  . Years of Education: N/A   Occupational History  . CNA     at [employer]   Social History Main Topics  . Smoking status: Former Smoker    Quit date: 02/26/2009  . Smokeless tobacco: Not on file  . Alcohol Use: Yes     rarely, 2 times a year maybe  . Drug Use: No  . Sexually Active: Not on file   Other Topics Concern  . Not on file   Social History Narrative  . No narrative on file    Family History  Problem Relation Age of Onset  . Diabetes Mother   . Heart disease Father     CHF  . Cancer Father     lymphoma, lung cancer   . Diabetes Sister   . Stroke Sister   . Dementia Sister   . Hypertension Brother   . Sleep apnea Brother   . Sleep apnea Brother    Current Outpatient Prescriptions on File Prior to Visit  Medication Sig Dispense Refill  . lansoprazole (PREVACID) 15 MG capsule Take 15 mg by mouth 2 (two) times daily.        . magnesium gluconate (MAGONATE) 500 MG tablet Take 500 mg by mouth daily.        . Potassium 99 MG TABS Take 1 tablet by mouth daily.        Marland Kitchen  pravastatin (PRAVACHOL) 80 MG tablet Take 1 tablet (80 mg total) by mouth daily.  30 tablet  0  . DISCONTD: buPROPion (WELLBUTRIN SR) 100 MG 12 hr tablet Take 1 tablet (100 mg total) by mouth 2 (two) times daily. Take evening dose no later than 5 pm  60 tablet  0  . DISCONTD: FLUoxetine (PROZAC) 40 MG capsule Take 1 capsule (40 mg total) by mouth daily.  30 capsule  0  . DISCONTD: lisinopril (PRINIVIL,ZESTRIL) 20 MG tablet Take 0.5 tablets (10 mg total) by mouth daily.  30 tablet  0  . DISCONTD: metFORMIN (GLUCOPHAGE) 500 MG tablet Take 1 tablet (500 mg total) by mouth 2 (two) times daily with a meal.  180 tablet  3    Allergies  Allergen Reactions  . Codeine Other (See Comments)    spacey  . Sulfa Antibiotics Itching   ROS:  Denies fevers, headaches, dizziness, chest pain, palpitations, shortness of breath, GI complaints, skin  rashes/lesions.  Had URI that lasted for a few months--finally resolved.  Denies flank pain, hematuria.  PHYSICAL EXAM: BP 122/78  Pulse 72  Ht 5\' 2"  (1.575 m)  Wt 169 lb (76.658 kg)  BMI 30.91 kg/m2  LMP 03/29/1989 Well developed, pleasant, overweight female in no distress Neck: no lymphadenopathy or mass Heart: regular rate and rhythm Lungs: clear bilaterally Abdomen: Mild suprapubic tenderness, no organomegaly or mass Back: no spinal or CVA tenderness Extremities: no clubbing, cyanosis or edema, 2+ pulses, normal sensation.  Normal diabetic foot exam Skin: no rashes/lesions Psych: became tearful in discussing her dad.  Full range of affect.  Normal hygiene and grooming.  Lab Results  Component Value Date   HGBA1C 5.3 07/05/2011   ASSESSMENT/PLAN: 1. Type II or unspecified type diabetes mellitus without mention of complication, not stated as uncontrolled  POCT HgB A1C, metFORMIN (GLUCOPHAGE) 500 MG tablet, FREE THYROXINE INDEX, CBC with Differential, RPR, Comprehensive metabolic panel, TSH, T3 uptake, T4, free, T4, Lipid panel  2. Abnormal urine odor  POCT Urinalysis Dipstick  3. Essential hypertension, benign    4. Pure hypercholesterolemia    5. Type II or unspecified type diabetes mellitus without mention of complication, not stated as uncontrolled  POCT HgB A1C, metFORMIN (GLUCOPHAGE) 500 MG tablet, FREE THYROXINE INDEX, CBC with Differential, RPR, Comprehensive metabolic panel, TSH, T3 uptake, T4, free, T4, Lipid panel   Patient needs to re-start checking sugars (especially if will be unable to return for A1c in 4-6 months).  Cont proper diet and weight loss  6. Unspecified essential hypertension  lisinopril (PRINIVIL,ZESTRIL) 20 MG tablet  7. Anxiety state, unspecified  FLUoxetine (PROZAC) 40 MG capsule   Overall is controlled.  The decreased ability to cry is currently tolerable.  If becomes a problem, she can decrease dose (ie. qod dosing or call for 20mg )  8. Depressive  disorder, not elsewhere classified  buPROPion (WELLBUTRIN SR) 100 MG 12 hr tablet  9. Urinary tract infection, site not specified  ciprofloxacin (CIPRO) 250 MG tablet  10. Restless leg syndrome     Patient is without insurance--check EP3 panel.  Wait on pravastatin refill until labs back.  RLS--consider neurontin vs clonazepam.  CALL SOMETHING FOR HER AFTER LABS BACK Drink plenty of fluids.  Consider grief counseling  Addendum--TG 165 otherwise lipids okay.  Refill pravastatin x 6 months. Mild anemia--need to look into why, appears likely to be iron deficient.  Start iron RLS--start klonopin 0.5 mg qHS

## 2011-07-06 ENCOUNTER — Encounter: Payer: Self-pay | Admitting: Family Medicine

## 2011-07-06 LAB — RPR

## 2011-07-06 MED ORDER — PRAVASTATIN SODIUM 80 MG PO TABS: 80.0000 mg | ORAL_TABLET | Freq: Every day | ORAL | Status: DC

## 2011-07-08 ENCOUNTER — Other Ambulatory Visit: Payer: Self-pay | Admitting: *Deleted

## 2011-07-08 MED ORDER — CLONAZEPAM 0.5 MG PO TABS: 0.5000 mg | ORAL_TABLET | Freq: Every day | ORAL | Status: DC

## 2011-08-02 ENCOUNTER — Other Ambulatory Visit: Payer: Self-pay | Admitting: *Deleted

## 2011-08-02 ENCOUNTER — Telehealth: Payer: Self-pay | Admitting: *Deleted

## 2011-08-02 MED ORDER — CLONAZEPAM 0.5 MG PO TABS: 0.5000 mg | ORAL_TABLET | Freq: Every day | ORAL | Status: DC

## 2011-08-02 NOTE — Telephone Encounter (Signed)
Okay for #30 with 2 refills 

## 2011-08-02 NOTE — Telephone Encounter (Signed)
Called and stated that clonazepam .5mg  qhs is working very well for her RLS symptoms. Would like refill called into [facility] in [place].

## 2011-08-03 ENCOUNTER — Other Ambulatory Visit (INDEPENDENT_AMBULATORY_CARE_PROVIDER_SITE_OTHER): Payer: Self-pay

## 2011-08-03 DIAGNOSIS — E611 Iron deficiency: Secondary | ICD-10-CM

## 2011-08-03 DIAGNOSIS — D649 Anemia, unspecified: Secondary | ICD-10-CM

## 2011-08-03 LAB — POC HEMOCCULT BLD/STL (HOME/3-CARD/SCREEN)
Card #3 Fecal Occult Blood, POC: POSITIVE
Fecal Occult Blood, POC: NEGATIVE

## 2011-08-04 DIAGNOSIS — D649 Anemia, unspecified: Secondary | ICD-10-CM | POA: Insufficient documentation

## 2011-11-26 ENCOUNTER — Telehealth: Payer: Self-pay | Admitting: Family Medicine

## 2011-11-26 MED ORDER — CLONAZEPAM 0.5 MG PO TABS: 0.5000 mg | ORAL_TABLET | Freq: Every day | ORAL | Status: DC

## 2011-11-26 NOTE — Telephone Encounter (Signed)
Okay to refill--please call in #30 with 2 refills (for restless leg syndrome) and document. Thanks

## 2011-11-26 NOTE — Telephone Encounter (Signed)
I called the patients medication to her pharmacy per Dr. Lynelle Doctor.CLS

## 2011-12-08 ENCOUNTER — Telehealth: Payer: Self-pay | Admitting: *Deleted

## 2011-12-08 NOTE — Telephone Encounter (Signed)
Patient's husband, [name] called and stated that Clariece has been taking 0.5mg  qhs of clonazepam for RLS. Hasn't been working quite as well lately. Last night she took 1mg  and felt much better today. Is this okay for her to take nightly? And if so can you call in a new rx? [facility] [address].

## 2011-12-08 NOTE — Telephone Encounter (Signed)
It looks like the 0.5 dose was started after her last visit, and appears to have worked well for a while at that dose.  Have her look back to the AVS to look at other contributors to RLS, rather than jumping to higher dose regularly.  I'd rather her stay on 0.5mg , and use two tabs only as needed.  If chronically requires 2 tabs, and no longer getting good benefit from 1, then we can discuss increasing dose.  This is a controlled substance, and might require visit to discuss if planning to change dose longterm

## 2011-12-08 NOTE — Telephone Encounter (Signed)
Will call for appt when they need it.

## 2012-01-13 ENCOUNTER — Ambulatory Visit (INDEPENDENT_AMBULATORY_CARE_PROVIDER_SITE_OTHER): Payer: BC Managed Care – PPO | Admitting: Family Medicine

## 2012-01-13 ENCOUNTER — Encounter: Payer: Self-pay | Admitting: Family Medicine

## 2012-01-13 VITALS — BP 122/84 | HR 80 | Ht 62.0 in | Wt 160.0 lb

## 2012-01-13 DIAGNOSIS — I10 Essential (primary) hypertension: Secondary | ICD-10-CM

## 2012-01-13 DIAGNOSIS — G2581 Restless legs syndrome: Secondary | ICD-10-CM

## 2012-01-13 DIAGNOSIS — R5381 Other malaise: Secondary | ICD-10-CM

## 2012-01-13 DIAGNOSIS — R5383 Other fatigue: Secondary | ICD-10-CM

## 2012-01-13 DIAGNOSIS — R82998 Other abnormal findings in urine: Secondary | ICD-10-CM

## 2012-01-13 DIAGNOSIS — R8290 Unspecified abnormal findings in urine: Secondary | ICD-10-CM

## 2012-01-13 DIAGNOSIS — D649 Anemia, unspecified: Secondary | ICD-10-CM

## 2012-01-13 DIAGNOSIS — Z79899 Other long term (current) drug therapy: Secondary | ICD-10-CM

## 2012-01-13 DIAGNOSIS — E119 Type 2 diabetes mellitus without complications: Secondary | ICD-10-CM

## 2012-01-13 LAB — CBC WITH DIFFERENTIAL/PLATELET
Eosinophils Absolute: 0.2 10*3/uL (ref 0.0–0.7)
Hemoglobin: 6.7 g/dL — CL (ref 12.0–15.0)
Lymphocytes Relative: 20 % (ref 12–46)
Lymphs Abs: 1.1 10*3/uL (ref 0.7–4.0)
MCH: 18.9 pg — ABNORMAL LOW (ref 26.0–34.0)
MCV: 64.5 fL — ABNORMAL LOW (ref 78.0–100.0)
Monocytes Relative: 16 % — ABNORMAL HIGH (ref 3–12)
Neutrophils Relative %: 58 % (ref 43–77)
Platelets: 456 10*3/uL — ABNORMAL HIGH (ref 150–400)
RBC: 3.55 MIL/uL — ABNORMAL LOW (ref 3.87–5.11)
WBC: 5.3 10*3/uL (ref 4.0–10.5)

## 2012-01-13 NOTE — Progress Notes (Signed)
Chief Complaint  Patient presents with  . Advice Only    having a hard time breathing, SOB upon exertion. Stopped taking bp medication in the month of October. Is not sleeping at all, maybe 2-3 hrs a night.   HPI:  Having some shortness of breath with exertion x 5 months.  Feels like her "heart is pounding out of my chest" along with shortness of breath.  Occurs even with just talking, and just her regular daily activities at work.  No regular exercise outside of work.  Stopped taking the lisinopril about a month ago to see if that would help with her symptoms, but didn't note any improvement.  BP's have been "good" when checked at work, but doesn't recall the numbers.   She was found to be anemic back in April.  She also had +hemoccult. It was recommended that she start iron, and get a colonoscopy (when she got insurance).  She couldn't tolerate the iron due to constipation, so didn't take it.  She has had insurance, but hasn't followed up for a med check on her chronic medical problems.  The reason for scheduling this visit today was for RLS symptoms worsening, and lack of sleep.  RLS:  Started on Klonopin 0.5mg  in April.  Phone messages from May reported it was working well, and only recently (phone call in September) stated that the 1mg  worked better.  One night she seemed to do better on the 1mg  dose, but she reports it didn't seem to help on other nights. She doesn't think her husband left accurate messages (she makes him call with her concerns, for refills, etc as she cannot call while working).  She has tried a lot to try and help with her leg discomfort. She has taken extra potassium, lots of tylenol at bedtime. Admits to taking FIVE tylenol at bedtime.  She stopped the klonopin because it wasn't working.  DM--she stopped her metformin because her sugars were all <100.  Sugars remained <115, but her boss told her she looked yellow, so she went back on the metformin (?somehow thought they were  related). She thinks her color has gotten a little better since restarting med..  Past Medical History  Diagnosis Date  . Hypertension   . Smoker     quit 11/1998  . Obesity   . GERD (gastroesophageal reflux disease)   . Anxiety associated with depression   . Dyslipidemia   . Diabetes mellitus 9/05    adult onset   Past Surgical History  Procedure Date  . Partial hysterectomy 1990  . Cholecystectomy 1987  . Cardiovascular stress test   . Bladder surgery 03/2008    bladder tack--unsuccessful per pt    History   Social History  . Marital Status: Married    Spouse Name: N/A    Number of Children: 2  . Years of Education: N/A   Occupational History  . CNA     at [employer]   Social History Main Topics  . Smoking status: Former Smoker    Quit date: 02/26/2009  . Smokeless tobacco: Not on file  . Alcohol Use: Yes     rarely, 2 times a year maybe  . Drug Use: No  . Sexually Active: Not on file   Other Topics Concern  . Not on file   Social History Narrative  . No narrative on file   Current outpatient prescriptions:acetaminophen (TYLENOL) 500 MG tablet, Take 2,000-2,500 mg by mouth at bedtime., Disp: , Rfl: ;  aspirin 325 MG tablet, Take 325 mg by mouth daily., Disp: , Rfl: ;  b complex vitamins tablet, Take 1 tablet by mouth daily., Disp: , Rfl: ;  buPROPion (WELLBUTRIN SR) 100 MG 12 hr tablet, Take 1 tablet (100 mg total) by mouth 2 (two) times daily. Take evening dose no later than 5 pm, Disp: 60 tablet, Rfl: 5 cholecalciferol (VITAMIN D) 1000 UNITS tablet, Take 1,000 Units by mouth daily., Disp: , Rfl: ;  famotidine (PEPCID) 20 MG tablet, Take 20 mg by mouth 2 (two) times daily., Disp: , Rfl: ;  FLUoxetine (PROZAC) 40 MG capsule, Take 1 capsule (40 mg total) by mouth daily., Disp: 30 capsule, Rfl: 5;  magnesium oxide (MAG-OX) 400 MG tablet, Take 400 mg by mouth 2 (two) times daily., Disp: , Rfl:  metFORMIN (GLUCOPHAGE) 500 MG tablet, Take 1 tablet (500 mg total) by  mouth 2 (two) times daily with a meal., Disp: 180 tablet, Rfl: 1;  Multiple Vitamins-Minerals (MULTIVITAMIN WITH MINERALS) tablet, Take 1 tablet by mouth daily., Disp: , Rfl: ;  Potassium 99 MG TABS, Take 2 tablets by mouth daily. , Disp: , Rfl: ;  pravastatin (PRAVACHOL) 80 MG tablet, Take 1 tablet (80 mg total) by mouth daily., Disp: 90 tablet, Rfl: 1 vitamin E 400 UNIT capsule, Take 400 Units by mouth daily., Disp: , Rfl: ;  clonazePAM (KLONOPIN) 0.5 MG tablet, Take 1 tablet (0.5 mg total) by mouth at bedtime., Disp: 30 tablet, Rfl: 2;  lisinopril (PRINIVIL,ZESTRIL) 20 MG tablet, Take 0.5 tablets (10 mg total) by mouth daily., Disp: 30 tablet, Rfl: 5;  Probiotic Product (TRUBIOTICS) CAPS, Take 1 capsule by mouth daily., Disp: , Rfl:   Allergies  Allergen Reactions  . Codeine Other (See Comments)    spacey  . Sulfa Antibiotics Itching   ROS: denies fevers, URI symptoms, cough, skin rash.  Denies nausea, vomiting, bloody or black stools, dysuria or frequency, but does report foul odor to her urine.  See HPI.  PHYSICAL EXAM: BP 122/84  Pulse 80  Ht 5\' 2"  (1.575 m)  Wt 160 lb (72.576 kg)  BMI 29.26 kg/m2  LMP 03/29/1989 Pale-appearing female, in no distress, accompanied by her husband HEENT: conjunctiva clear, sclera anicteric.  OP clear, mucus membranes moist Neck: no lymphadenopathy, thyromegaly or mass Heart: regular rate and rhythm Lungs: clear bilaterally Back: no CVA tenderness Abdomen: soft, nontender, no organomegaly or mass Extremities: no edema Skin: no rash, pale, no jaundice Psych: normal mood.  Somewhat irritable, upset about her lack of sleep  ASSESSMENT/PLAN:  1. Restless leg syndrome    2. Anemia, unspecified  CBC with Differential, Iron, Ferritin, Vitamin B12  3. Other malaise and fatigue  Comprehensive metabolic panel, CBC with Differential, Vitamin D 25 hydroxy  4. Type II or unspecified type diabetes mellitus without mention of complication, not stated as  uncontrolled  Hemoglobin A1c, Microalbumin / creatinine urine ratio  5. Essential hypertension, benign  Comprehensive metabolic panel, Urinalysis Dipstick  6. Encounter for long-term (current) use of other medications  Comprehensive metabolic panel  7. Abnormal urine odor  Urinalysis Dipstick  8. Anemia     We reviewed her labs from April, the fact that she had anemia with heme+ stool that hasn't been worked up, and likely significantly worse in the last 6 months.  Anemia would account for her shortness of breath, as well as worsening RLS symptoms.  She is past due for med check, but is nonfasting today.  Will check all labs except for  lipids--will need to return fasting for med check.  Discussed treatments of RLS, including klonopin, requip, mirapex, neurontin, but that likely these won't be effective currently if there are other contributing factors.  Check labs today.  Will need referral to GI for anemia, colonoscopy, and is willing to do so now. Will notify of lab results and arrange referral and f/u

## 2012-01-14 ENCOUNTER — Telehealth: Payer: Self-pay | Admitting: *Deleted

## 2012-01-14 ENCOUNTER — Encounter: Payer: Self-pay | Admitting: Internal Medicine

## 2012-01-14 ENCOUNTER — Encounter: Payer: Self-pay | Admitting: *Deleted

## 2012-01-14 ENCOUNTER — Ambulatory Visit (HOSPITAL_COMMUNITY)
Admission: RE | Admit: 2012-01-14 | Discharge: 2012-01-14 | Disposition: A | Discharge status: Home / Self Care | Payer: BC Managed Care – PPO | Source: Ambulatory Visit | Attending: Family Medicine | Admitting: Family Medicine

## 2012-01-14 DIAGNOSIS — D649 Anemia, unspecified: Secondary | ICD-10-CM | POA: Insufficient documentation

## 2012-01-14 LAB — VITAMIN B12: Vitamin B-12: 575 pg/mL (ref 211–911)

## 2012-01-14 LAB — MICROALBUMIN / CREATININE URINE RATIO: Microalb, Ur: 0.6 mg/dL (ref 0.00–1.89)

## 2012-01-14 LAB — COMPREHENSIVE METABOLIC PANEL
Albumin: 4.3 g/dL (ref 3.5–5.2)
Alkaline Phosphatase: 54 U/L (ref 39–117)
BUN: 16 mg/dL (ref 6–23)
CO2: 25 mEq/L (ref 19–32)
Glucose, Bld: 85 mg/dL (ref 70–99)
Total Bilirubin: 0.2 mg/dL — ABNORMAL LOW (ref 0.3–1.2)

## 2012-01-14 LAB — FERRITIN: Ferritin: 3 ng/mL — ABNORMAL LOW (ref 10–291)

## 2012-01-14 LAB — HEMOGLOBIN A1C: Hgb A1c MFr Bld: 5.6 % (ref <5.7)

## 2012-01-14 LAB — IRON: Iron: 11 ug/dL — ABNORMAL LOW (ref 42–145)

## 2012-01-14 LAB — ABO/RH: ABO/RH(D): O POS

## 2012-01-14 LAB — VITAMIN D 25 HYDROXY (VIT D DEFICIENCY, FRACTURES): Vit D, 25-Hydroxy: 46 ng/mL (ref 30–89)

## 2012-01-14 NOTE — Telephone Encounter (Signed)
Ladonna Snide from [facility] [phone] called yesterday to add this patient in for blood. she called back this morning, before I could call her back. I have informed her that we don't transfusion patients that aren't established with one of our physicians. I spoke with both the infusion room charge nurse and Trey Paula regarding this policy.    JMW

## 2012-01-14 NOTE — Progress Notes (Signed)
Quick Note:  Set her up for a type and cross for 2 units of packed cells and refer to GI. Make sure you get a hold of her today and she gets the blood today ______

## 2012-01-14 NOTE — Telephone Encounter (Signed)
To add this last note.. I gave the nurse from [facility] to try medical day or short stay for the patient to receive blood. JMW

## 2012-01-15 LAB — TYPE AND SCREEN: Unit division: 0

## 2012-01-17 ENCOUNTER — Ambulatory Visit (INDEPENDENT_AMBULATORY_CARE_PROVIDER_SITE_OTHER): Payer: BC Managed Care – PPO | Admitting: Family Medicine

## 2012-01-17 ENCOUNTER — Encounter: Payer: Self-pay | Admitting: Family Medicine

## 2012-01-17 VITALS — BP 128/78 | HR 68 | Ht 62.0 in | Wt 160.0 lb

## 2012-01-17 DIAGNOSIS — E119 Type 2 diabetes mellitus without complications: Secondary | ICD-10-CM

## 2012-01-17 DIAGNOSIS — K219 Gastro-esophageal reflux disease without esophagitis: Secondary | ICD-10-CM

## 2012-01-17 DIAGNOSIS — E7800 Pure hypercholesterolemia, unspecified: Secondary | ICD-10-CM

## 2012-01-17 DIAGNOSIS — D509 Iron deficiency anemia, unspecified: Secondary | ICD-10-CM | POA: Insufficient documentation

## 2012-01-17 DIAGNOSIS — I10 Essential (primary) hypertension: Secondary | ICD-10-CM

## 2012-01-17 LAB — CBC WITH DIFFERENTIAL/PLATELET
Basophils Absolute: 0.1 10*3/uL (ref 0.0–0.1)
Basophils Relative: 2 % — ABNORMAL HIGH (ref 0–1)
Hemoglobin: 9.7 g/dL — ABNORMAL LOW (ref 12.0–15.0)
Lymphocytes Relative: 17 % (ref 12–46)
MCHC: 30.2 g/dL (ref 30.0–36.0)
Monocytes Relative: 12 % (ref 3–12)
Neutro Abs: 3.9 10*3/uL (ref 1.7–7.7)
Neutrophils Relative %: 65 % (ref 43–77)
RDW: 20.9 % — ABNORMAL HIGH (ref 11.5–15.5)
WBC: 5.9 10*3/uL (ref 4.0–10.5)

## 2012-01-17 MED ORDER — ESOMEPRAZOLE MAGNESIUM 40 MG PO CPDR: 40.0000 mg | DELAYED_RELEASE_CAPSULE | Freq: Every day | ORAL | Status: DC

## 2012-01-17 NOTE — Patient Instructions (Addendum)
Keep your upcoming appointment with the GI doctor.  Stop aspirin.  If, in future, it is felt okay to resume (after GI evaluation), then I only recommend taking 81 mg daily, not 325 mg.  Stop Pepcid.  Take nexium once daily instead, until evaluated by GI doctors.  Diabetes--trial of stopping the Metformin.  Restart if sugars consistently >120-130

## 2012-01-17 NOTE — Progress Notes (Signed)
Chief Complaint  Patient presents with  . Anemia    follow up.   Patient presents for f/u on her anemia.  She was found to have Hg 6.7 on labs last week, and had a transfusion 3 days ago. She is also here to follow up on all other lab results.  Overall she is feeling a little better, she felt a little better yesterday compared to today, feeling tired today.  She worked over the weekend.  Still feeling fatigued, but didn't sleep well from RLS, even with Klonopin. Denies dizziness; shortness of breath improved.  Denies chest pain.  DM--sugars all around 110 or less.   GERD--denies any heartburn as long as she takes her pepcid. Previously took Nexium (and omeprazole, and prevacid) but had issues with insurance coverage. She denies heartburn, chest pain, abdominal pain, dysphagia. She denies nausea, vomiting, bowel changes, abdominal pain.  Denies any melena, hematochezia, coffee ground emesis or other GI complaints. HTN--denies dizziness, headaches, edema  Past Medical History  Diagnosis Date  . Hypertension   . Smoker     quit 11/1998  . Obesity   . GERD (gastroesophageal reflux disease)   . Anxiety associated with depression   . Dyslipidemia   . Diabetes mellitus 9/05    adult onset   Past Surgical History  Procedure Date  . Partial hysterectomy 1990  . Cholecystectomy 1987  . Cardiovascular stress test   . Bladder surgery 03/2008    bladder tack--unsuccessful per pt    History   Social History  . Marital Status: Married    Spouse Name: N/A    Number of Children: 2  . Years of Education: N/A   Occupational History  . CNA     at [employer]   Social History Main Topics  . Smoking status: Former Smoker    Quit date: 02/26/2009  . Smokeless tobacco: Not on file  . Alcohol Use: Yes     rarely, 2 times a year maybe  . Drug Use: No  . Sexually Active: Not on file   Other Topics Concern  . Not on file   Social History Narrative  . No narrative on file   Current  Outpatient Prescriptions on File Prior to Visit  Medication Sig Dispense Refill  . acetaminophen (TYLENOL) 500 MG tablet Take 2,000-2,500 mg by mouth at bedtime.      Marland Kitchen b complex vitamins tablet Take 1 tablet by mouth daily.      Marland Kitchen buPROPion (WELLBUTRIN SR) 100 MG 12 hr tablet Take 1 tablet (100 mg total) by mouth 2 (two) times daily. Take evening dose no later than 5 pm  60 tablet  5  . cholecalciferol (VITAMIN D) 1000 UNITS tablet Take 1,000 Units by mouth daily.      . clonazePAM (KLONOPIN) 0.5 MG tablet Take 1 tablet (0.5 mg total) by mouth at bedtime.  30 tablet  2  . FLUoxetine (PROZAC) 40 MG capsule Take 1 capsule (40 mg total) by mouth daily.  30 capsule  5  . lisinopril (PRINIVIL,ZESTRIL) 20 MG tablet Take 0.5 tablets (10 mg total) by mouth daily.  30 tablet  5  . magnesium oxide (MAG-OX) 400 MG tablet Take 400 mg by mouth 2 (two) times daily.      . Multiple Vitamins-Minerals (MULTIVITAMIN WITH MINERALS) tablet Take 1 tablet by mouth daily.      . Potassium 99 MG TABS Take 2 tablets by mouth daily.       . pravastatin (PRAVACHOL)  80 MG tablet Take 1 tablet (80 mg total) by mouth daily.  90 tablet  1  . Probiotic Product (TRUBIOTICS) CAPS Take 1 capsule by mouth daily.      . vitamin E 400 UNIT capsule Take 400 Units by mouth daily.       Allergies  Allergen Reactions  . Codeine Other (See Comments)    spacey  . Sulfa Antibiotics Itching   ROS:  Denies fevers, URI symptoms, cough, shortness of breath, GI complaints. Still has slight odor to urine; denies hematuria, urgency, frequency, incontinence or dysuria.  Denies vaginal bleeding, discharge.  Denies bleeding/bruising, skin rash  PHYSICAL EXAM: BP 128/78  Pulse 68  Ht 5\' 2"  (1.575 m)  Wt 160 lb (72.576 kg)  BMI 29.26 kg/m2  LMP 03/29/1989 Well developed, pleasant, pale-appearing female, in no distress.  Color is a little better (less pale) than last week, but still pale. Neck: no lymphadenopathy, thyromegaly, bruit,  mass Heart: regular rate and rhythm without murmur Lungs: clear bilaterally Abdomen: normal bowel sounds.  Nontender, no hepatosplenomegaly or mass Extremities: no edema Skin: no rash, bruising.  Pale Psych: normal mood, affect, hygiene and grooming Neuro: alert and oriented, normal gait, cranial nerves grossly intact  Lab Results  Component Value Date   WBC 5.3 01/13/2012   HGB 6.7* 01/13/2012   HCT 22.9* 01/13/2012   MCV 64.5* 01/13/2012   PLT 456* 01/13/2012  iron 11 Ferritin 3 Vitamin D and B12 levels were normal    Chemistry      Component Value Date/Time   NA 142 01/13/2012 1711   K 4.5 01/13/2012 1711   CL 109 01/13/2012 1711   CO2 25 01/13/2012 1711   BUN 16 01/13/2012 1711   CREATININE 0.67 01/13/2012 1711   CREATININE 0.68 04/16/2008 0700      Component Value Date/Time   CALCIUM 9.1 01/13/2012 1711   ALKPHOS 54 01/13/2012 1711   AST 14 01/13/2012 1711   ALT 13 01/13/2012 1711   BILITOT 0.2* 01/13/2012 1711     Lab Results  Component Value Date   HGBA1C 5.6 01/13/2012   Lab Results  Component Value Date   CHOL 157 07/05/2011   HDL 57 07/05/2011   LDLCALC 67 07/05/2011   TRIG 165* 07/05/2011   CHOLHDL 2.8 07/05/2011   ASSESSMENT/PLAN:  1. Iron deficiency anemia, unspecified  CBC with Differential   s/p transfusion 3 days ago; Check CBC today.  ? etiology, most likely GI losses. Asymptomatic re: any bleeding. GI appt next week  2. Type II or unspecified type diabetes mellitus without mention of complication, not stated as uncontrolled     well controlled.  trial off Metformin. Continue to check sugars, and restart metformin if sugars consistently >120-130  3. Pure hypercholesterolemia     well controlled.  continue pravastatin  4. GERD (gastroesophageal reflux disease)  esomeprazole (NEXIUM) 40 MG capsule   asymptomatic on OTC Pepcid.  Given iron deficiency anemia, change to Nexium while awaiting GI eval.  Samples given (issues with insurance coverage in past).    5. Essential hypertension, benign     well controlled   Anemia--s/p transfusion, but I'm sure still anemic.  GI consult next week.   Stop aspirin.  If, in future, it is felt okay to resume (after GI evaluation), then I only recommend taking 81 mg daily, not 325 mg as she has been taking.  GERD--Stop Pepcid.  Take nexium once daily, until evaluated by GI doctors. DM--trial of stopping the Metformin.  Restart if sugars consistently >120-130 HTN--controlled Lipids--controlled

## 2012-01-18 NOTE — Progress Notes (Signed)
Pt informed

## 2012-01-20 ENCOUNTER — Ambulatory Visit: Payer: Self-pay | Admitting: Family Medicine

## 2012-01-24 ENCOUNTER — Encounter: Payer: Self-pay | Admitting: Internal Medicine

## 2012-01-24 ENCOUNTER — Ambulatory Visit (INDEPENDENT_AMBULATORY_CARE_PROVIDER_SITE_OTHER): Payer: BC Managed Care – PPO | Admitting: Internal Medicine

## 2012-01-24 VITALS — BP 120/82 | HR 80 | Ht 62.0 in | Wt 163.0 lb

## 2012-01-24 DIAGNOSIS — K219 Gastro-esophageal reflux disease without esophagitis: Secondary | ICD-10-CM

## 2012-01-24 DIAGNOSIS — R634 Abnormal weight loss: Secondary | ICD-10-CM

## 2012-01-24 DIAGNOSIS — K5900 Constipation, unspecified: Secondary | ICD-10-CM

## 2012-01-24 DIAGNOSIS — D509 Iron deficiency anemia, unspecified: Secondary | ICD-10-CM

## 2012-01-24 MED ORDER — MOVIPREP 100 G PO SOLR: 1.0000 | Freq: Once | ORAL | Status: DC

## 2012-01-24 NOTE — Patient Instructions (Addendum)
You have been scheduled for an endoscopy and colonoscopy with propofol. Please follow the written instructions given to you at your visit today. Please pick up your prep at the pharmacy within the next 1-3 days. If you use inhalers (even only as needed) or a CPAP machine, please bring them with you on the day of your procedure. 

## 2012-01-24 NOTE — Progress Notes (Signed)
HISTORY OF PRESENT ILLNESS:  Natasha Owens is a 51 y.o. female with hypertension, obesity, GERD, anxiety/depression, and dyslipidemia. She sent today by Dr. Lynelle Doctor regarding newly diagnosed iron deficiency anemia. She is status post remote hysterectomy. Patient reports progressive problems with fatigue. 01/13/2012 CBC revealed a hemoglobin of 6.7 with MCV of 64.5. Iron studies confirmed iron deficiency with a ferritin level of 3. Comprehensive metabolic panel was normal. The patient underwent transfusion x2 units with followup hemoglobin 01/17/2012 of 9.7. It was recommended to the patient to go on oral iron, however she declined due to concerns regarding constipation. Patient reports long-standing problems with regurgitation and pyrosis. Recently placed on Nexium, which helps. She denies dysphagia or, pain. She had been using significant amounts of Aleve for leg discomfort. She tells me that recent Hemoccults returned one of 3 positive. A remote history of upper endoscopy and possible colonoscopy in [place], with uncertain findings. She describes the experiences unpleasant and questions whether they were able to complete the exam to do to inadequate sedation. She does report chronic constipation for which she takes stool softeners regularly. She moves her bowels every other day. She reports 40 pound weight loss over the past year. She attributes this to diet. She is accompanied today by her daughter and grandson. 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 She does feel better after transfusion. She denies melena, hematochezia, vaginal bleeding, or hematuria. She is not a blood donor.  REVIEW OF SYSTEMS:  All non-GI ROS negative except for anxiety, back pain, visual change, depression, fatigue, hearing problems, muscle cramps, night sweats, shortness of breath, insomnia, excessive thirst, excessive urination.  Past Medical History  Diagnosis Date  . Hypertension   . Smoker    quit 11/1998  . Obesity   . GERD (gastroesophageal reflux disease)   . Anxiety associated with depression   . Dyslipidemia   . Diabetes mellitus 9/05    adult onset  . Anemia   . Blood clotting disorder      ?DVT    Past Surgical History  Procedure Date  . Partial hysterectomy 1990  . Cholecystectomy 1987  . Cardiovascular stress test   . Bladder surgery 03/2008    bladder tack--unsuccessful per pt     Social History Natasha Owens  reports that she quit smoking about 2 years ago. Her smoking use included Cigarettes. She has never used smokeless tobacco. She reports that she drinks alcohol. She reports that she does not use illicit drugs.  family history includes Cancer in her father; Dementia in her sister; Diabetes in her mother and sister; Heart disease in her father; Hypertension in her brother; Sleep apnea in her brothers; and Stroke in her sister.  Allergies  Allergen Reactions  . Codeine Other (See Comments)    spacey  . Sulfa Antibiotics Itching       PHYSICAL EXAMINATION: Vital signs: BP 120/82  Pulse 80  Ht 5\' 2"  (1.575 m)  Wt 163 lb (73.936 kg)  BMI 29.81 kg/m2  LMP 03/29/1989  Constitutional: generally well-appearing, no acute distress Psychiatric: alert and oriented x3, cooperative Eyes: extraocular movements intact, anicteric, conjunctiva pink Mouth: oral pharynx moist, no lesions Neck: supple no lymphadenopathy Cardiovascular: heart regular rate and rhythm, no murmur Lungs: clear to auscultation bilaterally Abdomen: soft, nontender, nondistended, no obvious ascites, no peritoneal signs, normal bowel sounds, no organomegaly Rectal:deferred until colonoscopy Extremities: no lower extremity edema bilaterally Skin: no lesions on visible extremities Neuro: No focal deficits. next  ASSESSMENT:  #1. Iron deficiency anemia. Rule out GI mucosal lesion such as ulcer, Cameron erosions, or neoplasia #2. Hemoccult-positive stool #3. Chronic GERD #4.  Chronic constipation #5. Weight loss   PLAN:  #1. Colonoscopy and upper endoscopy with possible duodenal biopsies.The nature of the procedure, as well as the risks, benefits, and alternatives were carefully and thoroughly reviewed with the patient. Ample time for discussion and questions allowed. The patient understood, was satisfied, and agreed to proceed.  #2. Movi prep prescribed #3. Will need some form of iron supplementation

## 2012-01-25 ENCOUNTER — Telehealth: Payer: Self-pay | Admitting: Family Medicine

## 2012-01-25 DIAGNOSIS — E7800 Pure hypercholesterolemia, unspecified: Secondary | ICD-10-CM

## 2012-01-25 DIAGNOSIS — F411 Generalized anxiety disorder: Secondary | ICD-10-CM

## 2012-01-26 ENCOUNTER — Other Ambulatory Visit: Payer: Self-pay | Admitting: Internal Medicine

## 2012-01-26 DIAGNOSIS — E7800 Pure hypercholesterolemia, unspecified: Secondary | ICD-10-CM

## 2012-01-26 MED ORDER — PRAVASTATIN SODIUM 80 MG PO TABS: 80.0000 mg | ORAL_TABLET | Freq: Every day | ORAL | Status: DC

## 2012-01-26 MED ORDER — FLUOXETINE HCL 40 MG PO CAPS: 40.0000 mg | ORAL_CAPSULE | Freq: Every day | ORAL | Status: DC

## 2012-01-26 NOTE — Telephone Encounter (Signed)
Pt husband [name] was notified and will bring her in 11/1 for lipid panel. And he knows rx sent to pharmacy

## 2012-01-26 NOTE — Telephone Encounter (Signed)
rx's sent.  She is past due for lipids.  Please enter orders for lipid panel and schedule lab visit.  Thanks

## 2012-01-26 NOTE — Telephone Encounter (Signed)
Put orders in also

## 2012-01-28 ENCOUNTER — Other Ambulatory Visit: Payer: BC Managed Care – PPO

## 2012-01-28 ENCOUNTER — Telehealth: Payer: Self-pay | Admitting: Family Medicine

## 2012-01-28 DIAGNOSIS — K449 Diaphragmatic hernia without obstruction or gangrene: Secondary | ICD-10-CM

## 2012-01-28 DIAGNOSIS — E7800 Pure hypercholesterolemia, unspecified: Secondary | ICD-10-CM

## 2012-01-28 DIAGNOSIS — K648 Other hemorrhoids: Secondary | ICD-10-CM

## 2012-01-28 DIAGNOSIS — K5790 Diverticulosis of intestine, part unspecified, without perforation or abscess without bleeding: Secondary | ICD-10-CM

## 2012-01-28 DIAGNOSIS — D509 Iron deficiency anemia, unspecified: Secondary | ICD-10-CM

## 2012-01-28 HISTORY — DX: Diaphragmatic hernia without obstruction or gangrene: K44.9

## 2012-01-28 HISTORY — PX: ESOPHAGOGASTRODUODENOSCOPY: SHX1529

## 2012-01-28 HISTORY — DX: Diverticulosis of intestine, part unspecified, without perforation or abscess without bleeding: K57.90

## 2012-01-28 HISTORY — DX: Other hemorrhoids: K64.8

## 2012-01-28 HISTORY — PX: COLONOSCOPY: SHX174

## 2012-01-28 LAB — LIPID PANEL
Cholesterol: 172 mg/dL (ref 0–200)
HDL: 60 mg/dL (ref >39)
Triglycerides: 70 mg/dL (ref <150)

## 2012-01-28 LAB — CBC WITH DIFFERENTIAL/PLATELET
Basophils Relative: 1 % (ref 0–1)
Eosinophils Absolute: 0.2 10*3/uL (ref 0.0–0.7)
Eosinophils Relative: 3 % (ref 0–5)
HCT: 31.1 % — ABNORMAL LOW (ref 36.0–46.0)
Hemoglobin: 9.3 g/dL — ABNORMAL LOW (ref 12.0–15.0)
MCH: 20.4 pg — ABNORMAL LOW (ref 26.0–34.0)
MCHC: 29.9 g/dL — ABNORMAL LOW (ref 30.0–36.0)
Monocytes Absolute: 0.9 10*3/uL (ref 0.1–1.0)
Monocytes Relative: 13 % — ABNORMAL HIGH (ref 3–12)

## 2012-01-28 NOTE — Telephone Encounter (Signed)
JO ADDED ON THE CBC. CLS

## 2012-01-28 NOTE — Addendum Note (Signed)
Addended by: Leretha Dykes L on: 01/28/2012 04:00 PM   Modules accepted: Orders

## 2012-01-28 NOTE — Telephone Encounter (Signed)
Yes--that would be great.  CBC, dx 280.9.  Thanks

## 2012-01-28 NOTE — Telephone Encounter (Signed)
Dr. Kathlen Mody said that she has extra tubes of blood and wanted to check to see if you wanted to order a CBC along with lipid panel. Thanks, CLS

## 2012-02-16 ENCOUNTER — Ambulatory Visit (INDEPENDENT_AMBULATORY_CARE_PROVIDER_SITE_OTHER): Payer: BC Managed Care – PPO | Admitting: Family Medicine

## 2012-02-16 ENCOUNTER — Encounter: Payer: Self-pay | Admitting: Family Medicine

## 2012-02-16 VITALS — BP 120/82 | HR 60 | Ht 62.0 in | Wt 166.0 lb

## 2012-02-16 DIAGNOSIS — M549 Dorsalgia, unspecified: Secondary | ICD-10-CM

## 2012-02-16 DIAGNOSIS — M62838 Other muscle spasm: Secondary | ICD-10-CM

## 2012-02-16 DIAGNOSIS — K219 Gastro-esophageal reflux disease without esophagitis: Secondary | ICD-10-CM

## 2012-02-16 MED ORDER — TRAMADOL HCL 50 MG PO TABS: 50.0000 mg | ORAL_TABLET | Freq: Four times a day (QID) | ORAL | Status: DC | PRN

## 2012-02-16 MED ORDER — ESOMEPRAZOLE MAGNESIUM 40 MG PO CPDR: 40.0000 mg | DELAYED_RELEASE_CAPSULE | Freq: Every day | ORAL | Status: DC

## 2012-02-16 MED ORDER — CYCLOBENZAPRINE HCL 10 MG PO TABS: 5.0000 mg | ORAL_TABLET | Freq: Three times a day (TID) | ORAL | Status: DC | PRN

## 2012-02-16 NOTE — Progress Notes (Signed)
Chief Complaint  Patient presents with  . Shortness of Breath    and chest heaviness that started [facility] Sunday and has not gotten any better. Wonders if she may also be having muscle spasms in her back, puts pressure on her chest when she lays down. Last Wednesday had a spell while in [place] where she got out of the shower and felt lightheaded and dizzy, had to lay down for a while.   HPI:  While in [place] 1 week ago she had a dizzy spell while in the shower.  Since then she hasn't quite felt right.  Having a lot of sharp pains in her back that come and go, cause her to scream out in pain, "catches" with trying to get out of bed and certain movements.  Pain also radiates to upper stomach/lower sternum.  Describes this as a heaviness in her lower chest.  Feels better if she takes a break and sits down.    Denies nausea, vomiting or heartburn.  Had some queasiness last night, not now.  She drove to [place], and had no pain in back or chest/abdomen while driving/sitting.  She lifts all the time at work, and it has been very painful to work, but she continues to do it.  Denies any known injury/change in activity.  Having some pain in L shin.  Denies radiation of pain into legs, numbness/tingling/weakness in legs.  Grabbing sensation across entire back (lower thoracic area) with movements.  Pain is much worse at night, with position changes, rolling over.  Interfering with her sleep.  Isn't truly having shortness of breath.  Just having a "heaviness" in her lower chest.  A little more noticeable with deep breaths. Has tried heat, which hasn't helped.    H/o back pain in the past which required shots.  Appt with Dr. Marina Goodell is next week for EGD and colonoscopy--evaluation of her iron deficiency anemia.  Has been feeling better since transfusion  Past Medical History  Diagnosis Date  . Hypertension   . Smoker     quit 11/1998  . Obesity   . GERD (gastroesophageal reflux disease)   . Anxiety  associated with depression   . Dyslipidemia   . Diabetes mellitus 9/05    adult onset  . Anemia   . Blood clotting disorder      ?DVT   Past Surgical History  Procedure Date  . Partial hysterectomy 1990  . Cholecystectomy 1987  . Cardiovascular stress test   . Bladder surgery 03/2008    bladder tack--unsuccessful per pt    History   Social History  . Marital Status: Married    Spouse Name: N/A    Number of Children: 2  . Years of Education: N/A   Occupational History  . CNA     at [employer]   Social History Main Topics  . Smoking status: Former Smoker    Types: Cigarettes    Quit date: 02/26/2009  . Smokeless tobacco: Never Used  . Alcohol Use: Yes     Comment: rarely, 2 times a year maybe  . Drug Use: No  . Sexually Active: Not on file   Other Topics Concern  . Not on file   Social History Narrative  . No narrative on file   Current Outpatient Prescriptions on File Prior to Visit  Medication Sig Dispense Refill  . b complex vitamins tablet Take 1 tablet by mouth daily.      Marland Kitchen buPROPion (WELLBUTRIN SR) 100 MG 12  hr tablet Take 1 tablet (100 mg total) by mouth 2 (two) times daily. Take evening dose no later than 5 pm  60 tablet  5  . cholecalciferol (VITAMIN D) 1000 UNITS tablet Take 1,000 Units by mouth daily.      Marland Kitchen esomeprazole (NEXIUM) 40 MG capsule Take 1 capsule (40 mg total) by mouth daily.  10 capsule  0  . FLUoxetine (PROZAC) 40 MG capsule Take 1 capsule (40 mg total) by mouth daily.  30 capsule  5  . magnesium oxide (MAG-OX) 400 MG tablet Take 400 mg by mouth 2 (two) times daily.      . Multiple Vitamins-Minerals (MULTIVITAMIN WITH MINERALS) tablet Take 1 tablet by mouth daily.      . Potassium 99 MG TABS Take 2 tablets by mouth daily.       . pravastatin (PRAVACHOL) 80 MG tablet Take 1 tablet (80 mg total) by mouth daily.  90 tablet  0  . vitamin E 400 UNIT capsule Take 400 Units by mouth daily.      . clonazePAM (KLONOPIN) 0.5 MG tablet Take 1 mg by  mouth at bedtime.      Marland Kitchen MOVIPREP 100 G SOLR Take 1 kit (100 g total) by mouth once.  1 kit  0   Allergies  Allergen Reactions  . Codeine Other (See Comments)    spacey  . Sulfa Antibiotics Itching   ROS:  Denies fevers, URI symptoms, cough, vomiting, diarrhea, bowel changes, skin rash, bleeding/bruising.  Denies urinary complaints.  +back pain.  See HPI.  Denies heartburn, abdominal pain.  Moods are good.  PHYSICAL EXAM: BP 120/82  Pulse 60  Ht 5\' 2"  (1.575 m)  Wt 166 lb (75.297 kg)  BMI 30.36 kg/m2  SpO2 98%  LMP 03/29/1989 Well developed, pleasant female who doesn't appear to be in significant distress.  Some pain with movements Back: no spine or CVA tenderness. Tender at R rhomboid muscles, mild spasm.  No other muscle tenderness or spasm noted. Neck: no lymphadenopathy or mass Heart: regular rate and rhythm Lungs: clear bilaterally Abdomen: Epigastrium nontender.  Abdomen nontender, no organomegaly or mass Chest nontender Extremities: no edema  ASSESSMENT/PLAN: 1. Back pain  traMADol (ULTRAM) 50 MG tablet  2. Muscle spasm  cyclobenzaprine (FLEXERIL) 10 MG tablet  3. GERD (gastroesophageal reflux disease)  esomeprazole (NEXIUM) 40 MG capsule   continue Nexium.  EGD/colonoscopy as scheduled    Muscle relaxant, heat, massage and stretches.  Will start with flexeril, given that pain is worse at night and interfering with her sleep.  Risks and side effects reviewed.  Avoid NSAIDs due to issue of iron deficiency anemia.  Allergic to sulfa so can't use Celebrex. Recommend Tylenol as needed, as well as tramadol prn severe pain.  Continue Nexium--almost out of samples.  Given more.

## 2012-02-16 NOTE — Patient Instructions (Signed)
Muscle relaxant--start at 1/2 tablet at bedtime.  If 1/2 tablet isn't effective, increase to full.  This may cause significant drowsiness, so take just at bedtime.  If not causing any sedation/drowsiness, may use during the day.  If you need a muscle relaxant during the day, but flexeril makes you too sleepy, call for a trial of Robaxin (this is a less sedating muscle relaxant).  Also continue to use heat, try massage 2-3x/day and stretches as shown 2-3x/day.   Avoid NSAIDs (ibuprofen, motrin) due to issue of iron deficiency anemia. Recommend Tylenol as needed, as well as tramadol as needed for severe pain.  Continue Nexium  Return for re-evaluation if developing different symptoms--weakness, numbness, chest pain, true shortness of breath

## 2012-02-17 ENCOUNTER — Encounter: Payer: Self-pay | Admitting: Family Medicine

## 2012-02-22 ENCOUNTER — Encounter: Payer: Self-pay | Admitting: Internal Medicine

## 2012-02-22 ENCOUNTER — Ambulatory Visit (AMBULATORY_SURGERY_CENTER): Payer: BC Managed Care – PPO | Admitting: Internal Medicine

## 2012-02-22 VITALS — BP 149/88 | HR 63 | Temp 98.1°F | Resp 18 | Ht 62.0 in | Wt 163.0 lb

## 2012-02-22 DIAGNOSIS — K219 Gastro-esophageal reflux disease without esophagitis: Secondary | ICD-10-CM

## 2012-02-22 DIAGNOSIS — D1330 Benign neoplasm of unspecified part of small intestine: Secondary | ICD-10-CM

## 2012-02-22 DIAGNOSIS — D509 Iron deficiency anemia, unspecified: Secondary | ICD-10-CM

## 2012-02-22 DIAGNOSIS — K5730 Diverticulosis of large intestine without perforation or abscess without bleeding: Secondary | ICD-10-CM

## 2012-02-22 DIAGNOSIS — R195 Other fecal abnormalities: Secondary | ICD-10-CM

## 2012-02-22 MED ORDER — SODIUM CHLORIDE 0.9 % IV SOLN: 500.0000 mL | INTRAVENOUS | Status: DC

## 2012-02-22 NOTE — Progress Notes (Signed)
Patient did not experience any of the following events: a burn prior to discharge; a fall within the facility; wrong site/side/patient/procedure/implant event; or a hospital transfer or hospital admission upon discharge from the facility. (G8907) Patient did not have preoperative order for IV antibiotic SSI prophylaxis. (G8918)   Charted by April Mirts RN 

## 2012-02-22 NOTE — Op Note (Signed)
[facility] [address] [place], [address]   COLONOSCOPY PROCEDURE REPORT  PATIENT: Natasha Owens, Natasha Owens  MR#: [id number] BIRTHDATE: 1961-01-27 , 51  yrs. old GENDER: Female ENDOSCOPIST: Roxy Cedar, MD REFERRED Edison Simon, M.D. PROCEDURE DATE:  02/22/2012 PROCEDURE:   Colonoscopy, diagnostic ASA CLASS:   Class II INDICATIONS:Iron Deficiency Anemia and occult blood . MEDICATIONS: MAC sedation, administered by CRNA and propofol (Diprivan) 600mg  IV  DESCRIPTION OF PROCEDURE:   After the risks benefits and alternatives of the procedure were thoroughly explained, informed consent was obtained.  A digital rectal exam revealed no abnormalities of the rectum.   The LB [id number]  endoscope was introduced through the anus and advanced to the cecum, which was identified by both the appendix and ileocecal valve. No adverse events experienced.   The quality of the prep was excellent, using MoviPrep  The instrument was then slowly withdrawn as the colon was fully examined.      COLON FINDINGS: The mucosa appeared normal in the terminal ileum. Moderate diverticulosis was noted The finding was in the left colon.   The colon was otherwise normal.  There was no diverticulosis, inflammation, polyps or cancers unless previously stated.  Retroflexed views revealed internal hemorrhoids. The time to cecum=2 minutes 12 seconds.  Withdrawal time=10 minutes 18 seconds.  The scope was withdrawn and the procedure completed. COMPLICATIONS: There were no complications.  ENDOSCOPIC IMPRESSION: 1.   Normal mucosa in the terminal ileum 2.   Moderate diverticulosis was noted in the left colon 3.   The colon was otherwise normal  RECOMMENDATIONS: 1.  Continue current colorectal screening recommendations for "routine risk" patients with a repeat colonoscopy in 10 years. 2.  Upper endoscopy will be performed today (see report)   eSigned:  Roxy Cedar, MD 02/22/2012  3:10 PM   cc: Joselyn Arrow, MD and The Patient   PATIENT NAME:  Natasha Owens, Natasha Owens MR#: [id number]

## 2012-02-22 NOTE — Patient Instructions (Addendum)

## 2012-02-22 NOTE — Op Note (Signed)
[facility] [address] [place], [address]   ENDOSCOPY PROCEDURE REPORT  PATIENT: Natasha, Owens  MR#: [id number] BIRTHDATE: 03/13/1961 , 51  yrs. old GENDER: Female ENDOSCOPIST: Roxy Cedar, MD REFERRED BY:  Joselyn Arrow, M.D. PROCEDURE DATE:  02/22/2012 PROCEDURE:  EGD w/ biopsy ASA CLASS:     Class II INDICATIONS:  Iron deficiency anemia.   history of esophageal reflux.   Heme positive stool. MEDICATIONS: MAC sedation, administered by CRNA and propofol (Diprivan) 160mg  IV TOPICAL ANESTHETIC: none  DESCRIPTION OF PROCEDURE: After the risks benefits and alternatives of the procedure were thoroughly explained, informed consent was obtained.  The LB-GIF Q180 [id number] endoscope was introduced through the mouth and advanced to the second portion of the duodenum. Without limitations.  The instrument was slowly withdrawn as the mucosa was fully examined.      The upper, middle and distal third of the esophagus were carefully inspected and no abnormalities were noted.  The z-line was well seen at the GEJ.  The endoscope was pushed into the fundus which was normal including a retroflexed view.  The antrum, gastric body, first and second part of the duodenum were unremarkable. Retroflexed views revealed a 5cm hiatal hernia without erosions. The scope was then withdrawn from the patient and the procedure completed.  COMPLICATIONS: There were no complications. ENDOSCOPIC IMPRESSION: 1. Normal EGD 2. Moderate hiatal hernia without erosions 3. GERD  RECOMMENDATIONS: 1.  Iron supplement ONCE OR TWICE DAILY UNTIL BLOOD COUNTS AND IRON LEVELS NORMAL 2.  Call office next 2-3 days to schedule an office appointment for 6 weeks. Get a CBC (blood count) prior to your visit  REPEAT EXAM:  eSigned:  Roxy Cedar, MD 02/22/2012 3:18 PM   CC:Eve Lynelle Doctor, MD and The Patient

## 2012-02-23 ENCOUNTER — Telehealth: Payer: Self-pay

## 2012-02-23 ENCOUNTER — Other Ambulatory Visit: Payer: Self-pay | Admitting: Internal Medicine

## 2012-02-23 DIAGNOSIS — D649 Anemia, unspecified: Secondary | ICD-10-CM

## 2012-02-23 NOTE — Telephone Encounter (Signed)
Left a message at [phone] for the pt to call if any questions or concerns. Maw

## 2012-02-29 ENCOUNTER — Encounter: Payer: Self-pay | Admitting: Internal Medicine

## 2012-03-01 ENCOUNTER — Encounter: Payer: Self-pay | Admitting: Family Medicine

## 2012-03-01 DIAGNOSIS — K449 Diaphragmatic hernia without obstruction or gangrene: Secondary | ICD-10-CM | POA: Insufficient documentation

## 2012-03-01 DIAGNOSIS — K5730 Diverticulosis of large intestine without perforation or abscess without bleeding: Secondary | ICD-10-CM | POA: Insufficient documentation

## 2012-03-15 ENCOUNTER — Telehealth: Payer: Self-pay | Admitting: *Deleted

## 2012-03-15 DIAGNOSIS — M549 Dorsalgia, unspecified: Secondary | ICD-10-CM

## 2012-03-15 DIAGNOSIS — M62838 Other muscle spasm: Secondary | ICD-10-CM

## 2012-03-15 MED ORDER — CYCLOBENZAPRINE HCL 10 MG PO TABS: 5.0000 mg | ORAL_TABLET | Freq: Three times a day (TID) | ORAL | Status: DC | PRN

## 2012-03-15 MED ORDER — TRAMADOL HCL 50 MG PO TABS: 50.0000 mg | ORAL_TABLET | Freq: Four times a day (QID) | ORAL | Status: DC | PRN

## 2012-03-15 NOTE — Telephone Encounter (Signed)
Patient called and would like to know if you would refill the flexeril and tramadol that was given 02/16/12 for back pain and muscle spasm, she is still having the pain and spasm.

## 2012-03-15 NOTE — Telephone Encounter (Signed)
I refilled meds.  If problem isn't improving, she likely will need PT.  There will be no further refills without OV, and if ongoing pain, PT is the next step

## 2012-03-15 NOTE — Telephone Encounter (Signed)
Pt informed

## 2012-03-31 ENCOUNTER — Other Ambulatory Visit (INDEPENDENT_AMBULATORY_CARE_PROVIDER_SITE_OTHER): Payer: BC Managed Care – PPO

## 2012-03-31 DIAGNOSIS — D649 Anemia, unspecified: Secondary | ICD-10-CM

## 2012-03-31 LAB — CBC WITH DIFFERENTIAL/PLATELET
Basophils Relative: 0.6 % (ref 0.0–3.0)
Eosinophils Absolute: 0.2 10*3/uL (ref 0.0–0.7)
MCHC: 32.3 g/dL (ref 30.0–36.0)
MCV: 74.7 fl — ABNORMAL LOW (ref 78.0–100.0)
Monocytes Absolute: 0.6 10*3/uL (ref 0.1–1.0)
Neutrophils Relative %: 74.4 % (ref 43.0–77.0)
Platelets: 387 10*3/uL (ref 150.0–400.0)
RBC: 5.73 Mil/uL — ABNORMAL HIGH (ref 3.87–5.11)
RDW: 31.1 % — ABNORMAL HIGH (ref 11.5–14.6)

## 2012-04-05 ENCOUNTER — Encounter: Payer: Self-pay | Admitting: Internal Medicine

## 2012-04-05 ENCOUNTER — Ambulatory Visit (INDEPENDENT_AMBULATORY_CARE_PROVIDER_SITE_OTHER): Payer: BC Managed Care – PPO | Admitting: Internal Medicine

## 2012-04-05 VITALS — BP 130/88 | HR 72 | Ht 62.0 in | Wt 178.8 lb

## 2012-04-05 DIAGNOSIS — K449 Diaphragmatic hernia without obstruction or gangrene: Secondary | ICD-10-CM

## 2012-04-05 DIAGNOSIS — K219 Gastro-esophageal reflux disease without esophagitis: Secondary | ICD-10-CM

## 2012-04-05 DIAGNOSIS — D509 Iron deficiency anemia, unspecified: Secondary | ICD-10-CM

## 2012-04-05 DIAGNOSIS — K5730 Diverticulosis of large intestine without perforation or abscess without bleeding: Secondary | ICD-10-CM

## 2012-04-05 NOTE — Progress Notes (Signed)
HISTORY OF PRESENT ILLNESS:  Natasha Owens is a 52 y.o. female with hypertension, obesity, GERD, dyslipidemia, and anxiety/depression. She was evaluated in this office as a new patient 01/24/2012 regarding significant iron deficiency anemia (see that dictation). Hemoglobin in mid-October was 6.7 with MCV 64.5. She was symptomatic subsequently transfused. GI evaluation included colonoscopy and upper endoscopy performed 02/22/2012. Colonoscopy with ileal intubation was normal except for moderate left-sided diverticulosis. Routine followup in 10 years recommended. Upper endoscopy revealed a moderate-sized hiatal hernia but was otherwise normal. No [place] erosions observed. Duodenal biopsies were obtained and returned normal with no evidence of sprue. For reflux disease, she takes H2 receptor antagonist therapy. She has been on iron replacement once daily. She presents today for routine followup. Repeat laboratories last week revealed marked improvement in her blood counts. Hemoglobin normal at 13.8. MCV 74.7. She has no GI complaints. She is accompanied by her husband. She is tolerating iron well.  REVIEW OF SYSTEMS:  All non-GI ROS negative except for muscle aches, weight gain  Past Medical History  Diagnosis Date  . Hypertension   . Smoker     quit 11/1998  . Obesity   . GERD (gastroesophageal reflux disease)   . Anxiety associated with depression   . Dyslipidemia   . Diabetes mellitus 9/05    adult onset  . Anemia 2013    iron deficient  . Blood clotting disorder      ?DVT  . Diverticulosis 01/2012    L sided, seen on colonoscopy  . Hiatal hernia 01/2012    seen on EGD  . Restless leg syndrome     Past Surgical History  Procedure Date  . Partial hysterectomy 1990  . Cholecystectomy 1987  . Cardiovascular stress test   . Bladder surgery 03/2008    bladder tack--unsuccessful per pt   . Colonoscopy 01/2012    Dr. Marina Goodell  . Esophagogastroduodenoscopy 01/2012    Dr. Marina Goodell     Social History Natasha Owens  reports that she quit smoking about 3 years ago. Her smoking use included Cigarettes. She has never used smokeless tobacco. She reports that she drinks alcohol. She reports that she does not use illicit drugs.  family history includes Dementia in her sister; Diabetes in her mother and sister; Heart disease in her father; Hypertension in her brother; Lung cancer in her father; Lymphoma in her father; Sleep apnea in her brothers; and Stroke in her sister.  There is no history of Colon cancer and Colon polyps.  Allergies  Allergen Reactions  . Codeine Other (See Comments)    spacey  . Sulfa Antibiotics Itching       PHYSICAL EXAMINATION: Vital signs: BP 130/88  Pulse 72  Ht 5\' 2"  (1.575 m)  Wt 178 lb 12.8 oz (81.103 kg)  BMI 32.70 kg/m2  LMP 03/29/1989 General: Well-developed, well-nourished, no acute distress HEENT: Sclerae are anicteric, conjunctiva pink. Oral mucosa intact Lungs: Clear Heart: Regular Abdomen: soft, nontender, nondistended, no obvious ascites, no peritoneal signs, normal bowel sounds. No organomegaly. Extremities: No edema Psychiatric: alert and oriented x3. Cooperative    ASSESSMENT:  #1. Iron deficiency anemia. Extensive workup as described. I suspect that the etiology is chronic intermittent GI blood loss secondary to [facility] erosions, given the presence of the hiatal hernia. The not directly observed, they can be intermittent. Alternatively, she could have small bowel pathology such as AVMs. She has responded nicely to iron and has no complaints. #2. GERD. Symptoms controlled with H2 receptor antagonist  therapy #3. Recent colonoscopy with diverticulosis only #4. Multiple general medical problems   PLAN:  #1. Continue once daily iron supplement indefinitely. #2. Followup CBC in 6 weeks. She prefers having this done at her primary providers office. #3. Reflux precautions #4. Medical therapy as needed to control  reflux. #5. Repeat screening colonoscopy in 10 years. Interval GI followup as needed

## 2012-04-05 NOTE — Patient Instructions (Addendum)
Please follow up with Dr. Perry as needed 

## 2012-05-08 ENCOUNTER — Telehealth: Payer: Self-pay | Admitting: Internal Medicine

## 2012-05-08 DIAGNOSIS — F329 Major depressive disorder, single episode, unspecified: Secondary | ICD-10-CM

## 2012-05-08 MED ORDER — BUPROPION HCL ER (SR) 100 MG PO TB12: 100.0000 mg | ORAL_TABLET | Freq: Two times a day (BID) | ORAL | Status: DC

## 2012-05-08 NOTE — Telephone Encounter (Signed)
Is this okay to refill or is she in need of a med check? Please advise as I just wanted to double check with you, thanks.

## 2012-05-08 NOTE — Telephone Encounter (Signed)
Okay to refill x 3 months.  Will need med check in April or May (none scheduled)

## 2012-05-31 ENCOUNTER — Ambulatory Visit (INDEPENDENT_AMBULATORY_CARE_PROVIDER_SITE_OTHER): Payer: BC Managed Care – PPO | Admitting: Family Medicine

## 2012-05-31 VITALS — BP 154/96 | HR 72 | Ht 62.0 in | Wt 187.0 lb

## 2012-05-31 DIAGNOSIS — N390 Urinary tract infection, site not specified: Secondary | ICD-10-CM

## 2012-05-31 DIAGNOSIS — R232 Flushing: Secondary | ICD-10-CM

## 2012-05-31 DIAGNOSIS — I10 Essential (primary) hypertension: Secondary | ICD-10-CM

## 2012-05-31 DIAGNOSIS — R3915 Urgency of urination: Secondary | ICD-10-CM

## 2012-05-31 DIAGNOSIS — N951 Menopausal and female climacteric states: Secondary | ICD-10-CM

## 2012-05-31 DIAGNOSIS — R635 Abnormal weight gain: Secondary | ICD-10-CM

## 2012-05-31 LAB — POCT URINALYSIS DIPSTICK
Spec Grav, UA: 1.01
Urobilinogen, UA: NEGATIVE

## 2012-05-31 LAB — TSH: TSH: 0.688 u[IU]/mL (ref 0.350–4.500)

## 2012-05-31 MED ORDER — CIPROFLOXACIN HCL 250 MG PO TABS: 250.0000 mg | ORAL_TABLET | Freq: Two times a day (BID) | ORAL | Status: AC

## 2012-05-31 NOTE — Patient Instructions (Addendum)
Stop the weight loss medication as this may contribute to your high blood pressure.  Continue to  Monitor BP at home, and if remains consistently >130-135/80-85, then we will need to restart the lisinopril.  Return sooner than end of May appointment if BP's remain elevated Ensure to follow a low sodium diet.  Estroven (black cohosh and soy) can help with hot flashes.  If you are already doing this, and if [facility] is elevated and your menopausal symptoms are affecting your quality of life, then we can consider starting estrogen replacement. You must have a mammogram done before this will be prescribed.  Please schedule your mammogram (last was in 2007, recommended yearly)

## 2012-05-31 NOTE — Progress Notes (Signed)
Chief Complaint  Patient presents with  . Advice Only    nightsweats, hot flashes, irritablity,insomnia,weight gain and urinary urgency x 2-3 months. Also having some short term memory loss recently.   Hot flashes have increased over the last 2 months.  Having night sweats.  Getting up frequently at night, due to sweats, and going to the bathroom.  She has tried an OTC med from [employer] which hasn't helped (doesn't know the name or ingredients).  Using x 2 weeks only.  ?name (not sure if [place]).    Complaining of weight gain since October.  "I gained the weight back because I had the blood transfusion", doing NOTHING differently since then.  Other than working, she gets no regular exercise.  Continues do follow a diet from Dr. Neil Crouch.  She is also taking some sort of weight loss "pill".  She changed weight loss pill about 2 months ago.  Urge urinary incontinence, with increased frequency and urgency.  Sugar was 116 this morning, usually <120  Ears feel plugged.  Trouble with memory, moods, irritability  Past Medical History  Diagnosis Date  . Hypertension   . Smoker     quit 11/1998  . Obesity   . GERD (gastroesophageal reflux disease)   . Anxiety associated with depression   . Dyslipidemia   . Diabetes mellitus 9/05    adult onset  . Anemia 2013    iron deficient  . Blood clotting disorder      ?DVT  . Diverticulosis 01/2012    L sided, seen on colonoscopy  . Hiatal hernia 01/2012    seen on EGD  . Restless leg syndrome    Past Surgical History  Procedure Laterality Date  . Partial hysterectomy  1990  . Cholecystectomy  1987  . Cardiovascular stress test    . Bladder surgery  03/2008    bladder tack--unsuccessful per pt   . Colonoscopy  01/2012    Dr. Marina Goodell  . Esophagogastroduodenoscopy  01/2012    Dr. Marina Goodell   History   Social History  . Marital Status: Married    Spouse Name: N/A    Number of Children: 2  . Years of Education: N/A   Occupational History  . CNA      at [employer]   Social History Main Topics  . Smoking status: Former Smoker    Types: Cigarettes    Quit date: 02/26/2009  . Smokeless tobacco: Never Used  . Alcohol Use: Yes     Comment: rarely, 2 times a year maybe  . Drug Use: No  . Sexually Active: Not on file   Other Topics Concern  . Not on file   Social History Narrative  . No narrative on file   Current Outpatient Prescriptions on File Prior to Visit  Medication Sig Dispense Refill  . b complex vitamins tablet Take 1 tablet by mouth daily.      Marland Kitchen buPROPion (WELLBUTRIN SR) 100 MG 12 hr tablet Take 1 tablet (100 mg total) by mouth 2 (two) times daily. Take evening dose no later than 5 pm  180 tablet  0  . cholecalciferol (VITAMIN D) 1000 UNITS tablet Take 1,000 Units by mouth daily.      . Cimetidine (ACID REDUCER PO) Take 1 tablet by mouth 2 (two) times daily.      . cyclobenzaprine (FLEXERIL) 10 MG tablet Take 0.5-1 tablets (5-10 mg total) by mouth 3 (three) times daily as needed for muscle spasms.  20 tablet  0  . FLUoxetine (PROZAC) 40 MG capsule Take 1 capsule (40 mg total) by mouth daily.  30 capsule  5  . magnesium oxide (MAG-OX) 400 MG tablet Take 400 mg by mouth 2 (two) times daily.      . Multiple Vitamins-Minerals (MULTIVITAMIN WITH MINERALS) tablet Take 1 tablet by mouth daily.      . Potassium 99 MG TABS Take 2 tablets by mouth daily.       . pravastatin (PRAVACHOL) 80 MG tablet Take 1 tablet (80 mg total) by mouth daily.  90 tablet  0  . vitamin E 400 UNIT capsule Take 400 Units by mouth daily.      . clonazePAM (KLONOPIN) 0.5 MG tablet Take 1 mg by mouth at bedtime.      . ferrous sulfate (IRON SUPPLEMENT) 325 (65 FE) MG tablet Take 325 mg by mouth daily.      . traMADol (ULTRAM) 50 MG tablet Take 1-2 tablets (50-100 mg total) by mouth every 6 (six) hours as needed for pain.  30 tablet  0   No current facility-administered medications on file prior to visit.   (plus a weight loss med that she doesn't  know name of or ingredients, as well as a med for hot flashes)  Allergies  Allergen Reactions  . Codeine Other (See Comments)    spacey  . Sulfa Antibiotics Itching    ROS:  Denies fevers, chills, nausea or vomiting, flank pain.  +weight gain, night sweats, ear plugging, irritability, memory issues, urinary urgency/frequency  PHYSICAL EXAM: BP 150/100  Pulse 72  Ht 5\' 2"  (1.575 m)  Wt 187 lb (84.823 kg)  BMI 34.19 kg/m2  LMP 03/29/1989 154/96 on repeat by MD Well developed, obese female in no distress. She appears irritable/frustrated.  Poor recall of names of supplements/OTC meds she is taking. HEENT:  Conjunctiva clear.  OP clear Neck: no lymphadenopathy, thyromegaly or mass Heart: regular rate and rhythm Lungs: clear bilaterally Abdomen: soft, nontender, no mass Extremities: edema Psych: irritable, mildly depressed.  Normal hygiene and grooming.  Alert and oriented  Urine dip:  3+ leuks, trace blood  ASSESSMENT/PLAN:  Urinary urgency - Plan: POCT Urinalysis Dipstick  Essential hypertension, benign  Weight gain - Plan: TSH  Hot flashes - Plan: Follicle Stimulating Hormone  Urinary tract infection, site not specified - Plan: Urine culture, ciprofloxacin (CIPRO) 250 MG tablet  Stop the weight loss medication as this may contribute to your high blood pressure.  Continue to  Monitor BP at home, and if remains consistently >130-135/80-85, then we will need to restart the lisinopril. Ensure to follow a low sodium diet.  Estroven (black cohosh and soy) can help with hot flashes.  If you are already doing this, and if [facility] is elevated and your menopausal symptoms are affecting your quality of life, then we can consider starting estrogen replacement. You must have a mammogram done before this will be prescribed.  Please schedule your mammogram (last was in 2007, recommended yearly)

## 2012-06-01 ENCOUNTER — Other Ambulatory Visit: Payer: Self-pay | Admitting: Family Medicine

## 2012-06-01 DIAGNOSIS — Z1231 Encounter for screening mammogram for malignant neoplasm of breast: Secondary | ICD-10-CM

## 2012-06-02 ENCOUNTER — Encounter: Payer: Self-pay | Admitting: Family Medicine

## 2012-06-04 LAB — URINE CULTURE

## 2012-06-05 ENCOUNTER — Other Ambulatory Visit: Payer: Self-pay | Admitting: *Deleted

## 2012-06-05 DIAGNOSIS — N390 Urinary tract infection, site not specified: Secondary | ICD-10-CM

## 2012-06-05 MED ORDER — CEFACLOR 250 MG PO CAPS: 250.0000 mg | ORAL_CAPSULE | Freq: Three times a day (TID) | ORAL | Status: DC

## 2012-06-06 ENCOUNTER — Telehealth: Payer: Self-pay | Admitting: Family Medicine

## 2012-06-07 ENCOUNTER — Other Ambulatory Visit: Payer: Self-pay | Admitting: *Deleted

## 2012-06-07 DIAGNOSIS — E7800 Pure hypercholesterolemia, unspecified: Secondary | ICD-10-CM

## 2012-06-07 MED ORDER — PRAVASTATIN SODIUM 80 MG PO TABS: 80.0000 mg | ORAL_TABLET | Freq: Every day | ORAL | Status: DC

## 2012-06-07 NOTE — Telephone Encounter (Signed)
Done

## 2012-06-09 NOTE — Telephone Encounter (Signed)
done

## 2012-06-30 ENCOUNTER — Ambulatory Visit
Admission: RE | Admit: 2012-06-30 | Discharge: 2012-06-30 | Disposition: A | Discharge status: Home / Self Care | Payer: BC Managed Care – PPO | Source: Ambulatory Visit | Attending: Family Medicine | Admitting: Family Medicine

## 2012-06-30 DIAGNOSIS — Z1231 Encounter for screening mammogram for malignant neoplasm of breast: Secondary | ICD-10-CM

## 2012-07-27 ENCOUNTER — Telehealth: Payer: Self-pay | Admitting: Family Medicine

## 2012-07-27 NOTE — Telephone Encounter (Signed)
To see when the original rx was from. It was from 06/2011 and this one that was filled was the last of the fifth refill filled on 06/05/2012. Asked patient's husband to find out if she needed this refill? Dr.Knapp's note said that she was to restart this medication if her readings were within a certain range, so I am assuming that they were and she didn't call us as she has one refill left on her rx from last year. Just wanted to double check and let Dr.Knapp know what I was doing and why.

## 2012-07-27 NOTE — Telephone Encounter (Signed)
Spoke with patient's husband to find out if patient was truly taking this medication and needed a refill as it was not on her current med list. I called the pharmacy t

## 2012-07-27 NOTE — Telephone Encounter (Signed)
Fax refill request from [facility] [place] ph [phone]  Lisinopril 20mg  #30, take 1/2 tab daily last filled 06/05/12

## 2012-07-31 ENCOUNTER — Telehealth: Payer: Self-pay | Admitting: *Deleted

## 2012-07-31 MED ORDER — LISINOPRIL 20 MG PO TABS: 20.0000 mg | ORAL_TABLET | Freq: Every day | ORAL | Status: DC

## 2012-07-31 NOTE — Telephone Encounter (Signed)
Spoke with pt's husband and pt restarted lisinopril after last visit because her bp's were high, he did not have any readings. She is taking 1qd and needs a refill, she has a med check scheduled at the end of this month. Is this okay to refill? Please advise.

## 2012-07-31 NOTE — Telephone Encounter (Signed)
Okay to refill lisinopril 20mg  #30

## 2012-08-04 ENCOUNTER — Other Ambulatory Visit: Payer: Self-pay | Admitting: Family Medicine

## 2012-08-04 NOTE — Telephone Encounter (Signed)
Is this okay to refill? CLS 

## 2012-08-24 ENCOUNTER — Ambulatory Visit (INDEPENDENT_AMBULATORY_CARE_PROVIDER_SITE_OTHER): Payer: BC Managed Care – PPO | Admitting: Family Medicine

## 2012-08-24 ENCOUNTER — Encounter: Payer: Self-pay | Admitting: Family Medicine

## 2012-08-24 VITALS — BP 128/82 | HR 72 | Ht 63.0 in | Wt 188.0 lb

## 2012-08-24 DIAGNOSIS — E7800 Pure hypercholesterolemia, unspecified: Secondary | ICD-10-CM

## 2012-08-24 DIAGNOSIS — G4700 Insomnia, unspecified: Secondary | ICD-10-CM

## 2012-08-24 DIAGNOSIS — D649 Anemia, unspecified: Secondary | ICD-10-CM

## 2012-08-24 DIAGNOSIS — E119 Type 2 diabetes mellitus without complications: Secondary | ICD-10-CM

## 2012-08-24 DIAGNOSIS — F411 Generalized anxiety disorder: Secondary | ICD-10-CM

## 2012-08-24 DIAGNOSIS — I10 Essential (primary) hypertension: Secondary | ICD-10-CM

## 2012-08-24 LAB — POCT GLYCOSYLATED HEMOGLOBIN (HGB A1C): Hemoglobin A1C: 5.9

## 2012-08-24 MED ORDER — LISINOPRIL 20 MG PO TABS: 20.0000 mg | ORAL_TABLET | Freq: Every day | ORAL | Status: DC

## 2012-08-24 MED ORDER — FLUOXETINE HCL 40 MG PO CAPS: 40.0000 mg | ORAL_CAPSULE | Freq: Every day | ORAL | Status: DC

## 2012-08-24 MED ORDER — ZOLPIDEM TARTRATE 10 MG PO TABS: 5.0000 mg | ORAL_TABLET | Freq: Every evening | ORAL | Status: DC | PRN

## 2012-08-24 NOTE — Patient Instructions (Addendum)
Return next week for fasting labs. Try and increase exercise to at least 30-45 minutes per day.  Eat healthy, proper portion sizes, and try to lose a little weight.  Remember the Remus Loffler is for sporadic/short-term use.  If requiring it daily, every day, will need to change to the Ambien CR for longterm use.

## 2012-08-24 NOTE — Progress Notes (Signed)
Chief Complaint  Patient presents with  . med check    med check,  having trouble sleeping and has tried husband ambien 10mg  and was able to sleep all night,    Patient presents for med check.  Her chief complaint is that of insomnia, no longer related to RLS.  Diabetes--off medications.  Admits to not being careful with her diet.  Sugars have been running <100.  Last eye exam was recent, she was told no diabetic changes.  Denies numbness/tingling or skin lesions or sores.  Hypertension follow-up:  Blood pressures are not checked elsewhere.  Denies dizziness, headaches, chest pain.  Denies side effects of medications.  Hyperlipidemia follow-up: Compliant with medications and denies medication side effects.  Admits to not following any special diet.  Last labs were 12/2011. Not fasting today.  Hot flashes--much improved since on the black cohosh. RLS--no longer symptomatic since she was transfused.  No longer taking the clonazepam.  Depression--well controlled on current regimen.  Needs refill on prozac.  Recently filled wellbutrin  Her chief complaint today is that of being unable to sleep.She is no longer bothered by restless legs, but the clonazepam didn't help her sleep.  She is having trouble falling asleep and staying asleep.  She tried her husband's Remus Loffler a few times, and it was effective.  Also tried benadryl, PM meds and melatonin without benefit.  Past Medical History  Diagnosis Date  . Hypertension   . Smoker     quit 11/1998  . Obesity   . GERD (gastroesophageal reflux disease)   . Anxiety associated with depression   . Dyslipidemia   . Diabetes mellitus 9/05    adult onset--diet controlled  . Anemia 2013    iron deficient  . Blood clotting disorder      ?DVT  . Diverticulosis 01/2012    L sided, seen on colonoscopy  . Hiatal hernia 01/2012    seen on EGD  . Restless leg syndrome    Past Surgical History  Procedure Laterality Date  . Partial hysterectomy  1990   . Cholecystectomy  1987  . Cardiovascular stress test    . Bladder surgery  03/2008    bladder tack--unsuccessful per pt   . Colonoscopy  01/2012    Dr. Marina Goodell  . Esophagogastroduodenoscopy  01/2012    Dr. Marina Goodell   History   Social History  . Marital Status: Married    Spouse Name: N/A    Number of Children: 2  . Years of Education: N/A   Occupational History  . CNA     at [employer]   Social History Main Topics  . Smoking status: Former Smoker    Types: Cigarettes    Quit date: 02/26/2009  . Smokeless tobacco: Never Used  . Alcohol Use: Yes     Comment: rarely, 2 times a year maybe  . Drug Use: No  . Sexually Active: Not on file   Other Topics Concern  . Not on file   Social History Narrative  . No narrative on file   Current outpatient prescriptions:b complex vitamins tablet, Take 1 tablet by mouth daily., Disp: , Rfl: ;  BLACK COHOSH PO, Take by mouth., Disp: , Rfl: ;  buPROPion (WELLBUTRIN SR) 100 MG 12 hr tablet, TAKE 1 TABLET BY MOUTH TWICE DAILY . TAKE EVENING DOSE NO LATER THAN 5 PM., Disp: 180 tablet, Rfl: 0;  cholecalciferol (VITAMIN D) 1000 UNITS tablet, Take 1,000 Units by mouth daily., Disp: , Rfl:  Cimetidine (  ACID REDUCER PO), Take 1 tablet by mouth 2 (two) times daily., Disp: , Rfl: ;  FLUoxetine (PROZAC) 40 MG capsule, Take 1 capsule (40 mg total) by mouth daily., Disp: 90 capsule, Rfl: 1;  [place]lic 1000 MG CAPS, Take by mouth., Disp: , Rfl: ;  lisinopril (PRINIVIL,ZESTRIL) 20 MG tablet, Take 1 tablet (20 mg total) by mouth daily., Disp: 90 tablet, Rfl: 1 magnesium oxide (MAG-OX) 400 MG tablet, Take 400 mg by mouth 2 (two) times daily., Disp: , Rfl: ;  Multiple Vitamins-Minerals (MULTIVITAMIN WITH MINERALS) tablet, Take 1 tablet by mouth daily., Disp: , Rfl: ;  Potassium 99 MG TABS, Take 2 tablets by mouth daily. , Disp: , Rfl: ;  pravastatin (PRAVACHOL) 80 MG tablet, Take 1 tablet (80 mg total) by mouth daily., Disp: 90 tablet, Rfl: 0;  SOY ISOFLAVONES PO, Take  80 mg by mouth., Disp: , Rfl:  vitamin B-12 (CYANOCOBALAMIN) 500 MCG tablet, Take 500 mcg by mouth daily., Disp: , Rfl: ;  cefaclor (CECLOR) 250 MG capsule, Take 1 capsule (250 mg total) by mouth 3 (three) times daily., Disp: 21 capsule, Rfl: 0;  clonazePAM (KLONOPIN) 0.5 MG tablet, Take 1 mg by mouth at bedtime., Disp: , Rfl:  cyclobenzaprine (FLEXERIL) 10 MG tablet, Take 0.5-1 tablets (5-10 mg total) by mouth 3 (three) times daily as needed for muscle spasms., Disp: 20 tablet, Rfl: 0;  ferrous sulfate (IRON SUPPLEMENT) 325 (65 FE) MG tablet, Take 325 mg by mouth daily., Disp: , Rfl: ;  traMADol (ULTRAM) 50 MG tablet, Take 1-2 tablets (50-100 mg total) by mouth every 6 (six) hours as needed for pain., Disp: 30 tablet, Rfl: 0 vitamin E 400 UNIT capsule, Take 400 Units by mouth daily., Disp: , Rfl: ;  zolpidem (AMBIEN) 10 MG tablet, Take 0.5-1 tablets (5-10 mg total) by mouth at bedtime as needed for sleep., Disp: 30 tablet, Rfl: 0 (ambien prescribed today, not prior to visit)  Allergies  Allergen Reactions  . Codeine Other (See Comments)    spacey  . Sulfa Antibiotics Itching   ROS:  Denies fevers, URI symptoms ,cough, shortness of breath, chest pain, palpitations, reflux is controlled, no dysphagia, bowel changes, blood in stool.  Denies urinary complaints, rashes, bleeding/bruising.  Denies depression.  Denies numbness/tingling/weakness.  PHYSICAL EXAM: BP 128/82  Pulse 72  Ht 5\' 3"  (1.6 m)  Wt 188 lb (85.276 kg)  BMI 33.31 kg/m2  LMP 03/29/1989 Well developed, pleasant female, accompanied by her husband, in no distress HEENT:  PERRL, EOMI, conjunctiva clear.  Fundi benign Neck: no lymphadenopathy, thyromegaly or mass. No bruits Heart: regular rate and rhythm without murmur Lungs: clear bilaterally  Abdomen: soft, nontender, no organomegaly or mass  Back: no spinal or CVA tenderness  Extremities: no clubbing, cyanosis or edema, 2+ pulses, normal sensation. Normal diabetic foot exam   Skin: no rashes/lesions  Psych: normal mood, affect, hygiene and grooming Neuro: alert and oriented.  Cranial nerves intact, normal gait, sensation, strength  Lab Results  Component Value Date   HGBA1C 5.9% 08/24/2012   ASSESSMENT/PLAN:  Type II or unspecified type diabetes mellitus without mention of complication, not stated as uncontrolled - off medication, controlled - Plan: HgB A1c, HM Diabetes Foot Exam  Anemia  Essential hypertension, benign - controlled - Plan: Comprehensive metabolic panel, lisinopril (PRINIVIL,ZESTRIL) 20 MG tablet  Pure hypercholesterolemia - Plan: Comprehensive metabolic panel, Lipid panel  Insomnia - Plan: zolpidem (AMBIEN) 10 MG tablet  Anxiety state, unspecified - controlled - Plan: FLUoxetine (PROZAC) 40 MG  capsule  Insomnia--discussed risks, and side effects of ambien.  To use intermittently/sporadically.  Can try other OTC measures intermittently (PM medications, melatonin).  Understands that if needs daily, will need to be changed to the CR which is approved for longterm use  Lipids--past due for labs not fasting today.  Will need pravastatin refilled after labs reviewed  F/u 6 months for CPE/med check

## 2012-08-25 ENCOUNTER — Encounter: Payer: Self-pay | Admitting: Family Medicine

## 2012-08-29 ENCOUNTER — Other Ambulatory Visit: Payer: BC Managed Care – PPO

## 2012-09-06 ENCOUNTER — Telehealth: Payer: Self-pay | Admitting: Internal Medicine

## 2012-09-06 DIAGNOSIS — G4700 Insomnia, unspecified: Secondary | ICD-10-CM

## 2012-09-06 MED ORDER — ZOLPIDEM TARTRATE ER 12.5 MG PO TBCR: 12.5000 mg | EXTENDED_RELEASE_TABLET | Freq: Every evening | ORAL | Status: DC | PRN

## 2012-09-06 NOTE — Telephone Encounter (Signed)
Is the ambien 10mg  effective? Find out if using 1/2 or full tablet.  If using full tablet, then okay to change to ambien CR 12.5mg  #30 with 2 refills.  This is safe/approved for longterm use (whereas the plain Remus Loffler is for sporadic or short-term use).  Okay to use it daily for the rx that she has, but wouldn't keep prescribing it longterm.  (if only using 1/2 tablet, then rx the 6.25mg  as pt canNOT cut the CR tablet in half due to the coating)

## 2012-09-06 NOTE — Telephone Encounter (Signed)
Pt would like a med so she can use everyday to sleep. She was told not to use the [place] 10mg  everyday and needs something. Send to [facility] in [place]

## 2012-09-06 NOTE — Telephone Encounter (Signed)
Spoke with [name], patient's husband and the 10mg  IS effective, Lun[place] takes a full tablet. So I did go ahead and call in the ambien CR 12.5mg  #30 with 2 refills.

## 2012-09-08 ENCOUNTER — Other Ambulatory Visit: Payer: BC Managed Care – PPO

## 2012-09-08 DIAGNOSIS — I10 Essential (primary) hypertension: Secondary | ICD-10-CM

## 2012-09-08 DIAGNOSIS — E7800 Pure hypercholesterolemia, unspecified: Secondary | ICD-10-CM

## 2012-09-08 LAB — COMPREHENSIVE METABOLIC PANEL
ALT: 15 U/L (ref 0–35)
AST: 17 U/L (ref 0–37)
Albumin: 4.3 g/dL (ref 3.5–5.2)
Alkaline Phosphatase: 67 U/L (ref 39–117)
Glucose, Bld: 112 mg/dL — ABNORMAL HIGH (ref 70–99)
Potassium: 4.7 mEq/L (ref 3.5–5.3)
Sodium: 138 mEq/L (ref 135–145)
Total Bilirubin: 0.4 mg/dL (ref 0.3–1.2)
Total Protein: 6.4 g/dL (ref 6.0–8.3)

## 2012-09-08 LAB — LIPID PANEL
Cholesterol: 173 mg/dL (ref 0–200)
HDL: 62 mg/dL (ref >39)
LDL Cholesterol: 89 mg/dL (ref 0–99)
Total CHOL/HDL Ratio: 2.8 Ratio
Triglycerides: 111 mg/dL (ref <150)
VLDL: 22 mg/dL (ref 0–40)

## 2012-09-14 ENCOUNTER — Other Ambulatory Visit: Payer: Self-pay | Admitting: *Deleted

## 2012-09-14 DIAGNOSIS — E7800 Pure hypercholesterolemia, unspecified: Secondary | ICD-10-CM

## 2012-09-14 MED ORDER — PRAVASTATIN SODIUM 80 MG PO TABS: 80.0000 mg | ORAL_TABLET | Freq: Every day | ORAL | Status: DC

## 2012-09-22 ENCOUNTER — Other Ambulatory Visit: Payer: Self-pay | Admitting: Medical

## 2012-09-22 ENCOUNTER — Encounter: Payer: Self-pay | Admitting: Internal Medicine

## 2012-09-22 ENCOUNTER — Ambulatory Visit (INDEPENDENT_AMBULATORY_CARE_PROVIDER_SITE_OTHER): Payer: BC Managed Care – PPO | Admitting: Medical

## 2012-09-22 ENCOUNTER — Encounter: Payer: Self-pay | Admitting: Medical

## 2012-09-22 VITALS — BP 120/80 | HR 60 | Temp 97.8°F | Resp 16 | Wt 185.0 lb

## 2012-09-22 DIAGNOSIS — R5381 Other malaise: Secondary | ICD-10-CM

## 2012-09-22 DIAGNOSIS — G2581 Restless legs syndrome: Secondary | ICD-10-CM

## 2012-09-22 DIAGNOSIS — Z862 Personal history of diseases of the blood and blood-forming organs and certain disorders involving the immune mechanism: Secondary | ICD-10-CM

## 2012-09-22 DIAGNOSIS — R0602 Shortness of breath: Secondary | ICD-10-CM

## 2012-09-22 LAB — CBC WITH DIFFERENTIAL/PLATELET
Basophils Relative: 1 % (ref 0–1)
Eosinophils Absolute: 0.3 10*3/uL (ref 0.0–0.7)
Eosinophils Relative: 4 % (ref 0–5)
Lymphs Abs: 1.2 10*3/uL (ref 0.7–4.0)
MCH: 22.6 pg — ABNORMAL LOW (ref 26.0–34.0)
MCHC: 32.4 g/dL (ref 30.0–36.0)
MCV: 69.6 fL — ABNORMAL LOW (ref 78.0–100.0)
Neutrophils Relative %: 72 % (ref 43–77)
Platelets: 462 10*3/uL — ABNORMAL HIGH (ref 150–400)
RBC: 4.21 MIL/uL (ref 3.87–5.11)
RDW: 16.4 % — ABNORMAL HIGH (ref 11.5–15.5)

## 2012-09-22 LAB — IRON AND TIBC
%SAT: 5 % — ABNORMAL LOW (ref 20–55)
Iron: 26 ug/dL — ABNORMAL LOW (ref 42–145)

## 2012-09-22 MED ORDER — POLYSACCHARIDE IRON COMPLEX 150 MG PO CAPS: 150.0000 mg | ORAL_CAPSULE | Freq: Two times a day (BID) | ORAL | Status: DC

## 2012-09-22 NOTE — Progress Notes (Signed)
Subjective: Normally sees Dr. Lynelle Doctor here.  c/o fatigue. She notes low iron in 12/2011, had to have blood transfusion.  Feels now like she did then.  Starting to get restless, legs restless, SOB, no energy.  Been feeling this way the last 2 weeks.   Does report some DOE, but may be deconditioning as she doesn't exercise.  With the 10/13 transfusion, had low iron, ended up having EGD and colonoscopy which showed no source of bleed per patient.  She is s/p hysterectomy.   Denies chest pain, palpitations, no jaw or arm pain, no paresthesias.  Denies bleeding or bruising.    Past Medical History  Diagnosis Date  . Hypertension   . Smoker     quit 11/1998  . Obesity   . GERD (gastroesophageal reflux disease)   . Anxiety associated with depression   . Dyslipidemia   . Diabetes mellitus 9/05    adult onset--diet controlled  . Anemia 2013    iron deficient  . Blood clotting disorder      ?DVT  . Diverticulosis 01/2012    L sided, seen on colonoscopy  . Hiatal hernia 01/2012    seen on EGD  . Restless leg syndrome     Objective: Filed Vitals:   09/22/12 0858  BP: 120/80  Pulse: 60  Temp: 97.8 F (36.6 C)  Resp: 16    General appearance: alert, no distress, WD/WN Oral cavity: MMM, no lesions Neck: supple, no lymphadenopathy, no thyromegaly, no masses Heart: RRR, normal S1, S2, no murmurs Lungs: CTA bilaterally, no wheezes, rhonchi, or rales Abdomen: +bs, soft, non tender, non distended, no masses, no hepatomegaly, no splenomegaly Pulses: 2+ symmetric, upper and lower extremities, normal cap refill Ext: no edema    Adult ECG Report  Indication: fatigue, SOB  Rate: 63bpm  Rhythm: normal sinus rhythm  QRS Axis: 10 degrees  PR Interval: 140 ms  QRS Duration: 76ms  QTc:  Conduction Disturbances: none  Other Abnormalities: none  Patient's cardiac risk factors are: diabetes mellitus, hypertension, obesity (BMI >= 30 kg/m2) and sedentary lifestyle.  EKG comparison: 2006    Narrative Interpretation: normal EKG    Assessment: Encounter Diagnoses  Name Primary?  . Other malaise and fatigue Yes  . SOB (shortness of breath)   . Restless leg   . History of anemia     Plan: Reviewed prior endoscopies, prior labs since October 2013. Labs today, reviewed EKG, discussed possible etiologies.  Etiology unclear.  May just be deconditioning, but await labs

## 2012-10-02 ENCOUNTER — Telehealth: Payer: Self-pay | Admitting: *Deleted

## 2012-10-02 NOTE — Telephone Encounter (Signed)
Narcotics can't be prescribed without eval.  Possible that pain is due to RLS due to worsening anemia (?). Let me know what "pain pill" was--if not a narcotic, then I might rx

## 2012-10-02 NOTE — Telephone Encounter (Signed)
Spoke with patient's husband and he said he thinks it was hydrocodone. They will call back and scheduled appointment later on this week.

## 2012-10-02 NOTE — Telephone Encounter (Signed)
Patient's husband, Natasha Owens called and stated that they came in and saw Natasha Owens a few weeks ago because Natasha Owens felt as if her iron was low again. Had cbc done and was put back on iron and is taking BID-wondering when she should come back in to have recheck on cbc. Also her restless leg symptoms have resolved but she is having b/l leg pains during the day. She has tried advil and tylenol and these do not help at all. She had a left over pain pill of some sort and took that and helped a great deal. Can you call her in something for the pain.

## 2012-10-10 ENCOUNTER — Telehealth: Payer: Self-pay | Admitting: *Deleted

## 2012-10-10 NOTE — Telephone Encounter (Signed)
Left message informing patient and asking them to call me back.

## 2012-10-10 NOTE — Telephone Encounter (Signed)
I want her to follow up with Dr. Marina Goodell (GI).  Recheck CBC and ferritin 4 weeks after she started the iron (if started after last visit, should be end of July).  Lab visit okay, but ensure f/u with Dr. Marina Goodell scheduled, and that he gets cc'd her most recent and upcoming labs as FYI. Thanks

## 2012-10-10 NOTE — Telephone Encounter (Signed)
Patient's husband called and wondered how long she needed to stay on the iron supplement that Vincenza Hews had her start a few weeks ago? Also do you want her to follow up with you or just come in and have a cbc done?

## 2012-10-11 ENCOUNTER — Other Ambulatory Visit: Payer: Self-pay | Admitting: *Deleted

## 2012-10-11 ENCOUNTER — Telehealth: Payer: Self-pay | Admitting: *Deleted

## 2012-10-11 DIAGNOSIS — D649 Anemia, unspecified: Secondary | ICD-10-CM

## 2012-10-11 NOTE — Telephone Encounter (Signed)
Spoke with patient's husband and patient is scheudule for 10/27/12 for recheck on cbc and ferritin. Also did scheduled appt with Dr.Perry (GI) for 10/30/12 for follow as directed. Patient's husband called in to ask if you would be able to call in xanax to last her until her appt with Dr.Perry? Patient is so worried about her anemia. She is literally freaking out and he thinks that xanax night help her.

## 2012-10-11 NOTE — Telephone Encounter (Signed)
Per computer, she has never been on xanax, therefore OV would be needed to address her anxiety (and risks/side effects of meds)--this is a controlled substance.

## 2012-10-12 ENCOUNTER — Encounter: Payer: Self-pay | Admitting: Family Medicine

## 2012-10-12 ENCOUNTER — Ambulatory Visit (INDEPENDENT_AMBULATORY_CARE_PROVIDER_SITE_OTHER): Payer: BC Managed Care – PPO | Admitting: Family Medicine

## 2012-10-12 VITALS — BP 130/80 | HR 68 | Ht 62.0 in | Wt 187.0 lb

## 2012-10-12 DIAGNOSIS — D649 Anemia, unspecified: Secondary | ICD-10-CM

## 2012-10-12 DIAGNOSIS — Z79899 Other long term (current) drug therapy: Secondary | ICD-10-CM

## 2012-10-12 DIAGNOSIS — IMO0001 Reserved for inherently not codable concepts without codable children: Secondary | ICD-10-CM

## 2012-10-12 DIAGNOSIS — D509 Iron deficiency anemia, unspecified: Secondary | ICD-10-CM

## 2012-10-12 DIAGNOSIS — G4700 Insomnia, unspecified: Secondary | ICD-10-CM

## 2012-10-12 LAB — CBC WITH DIFFERENTIAL/PLATELET
HCT: 31.7 % — ABNORMAL LOW (ref 36.0–46.0)
Hemoglobin: 9.8 g/dL — ABNORMAL LOW (ref 12.0–15.0)
Lymphocytes Relative: 16 % (ref 12–46)
Lymphs Abs: 1 10*3/uL (ref 0.7–4.0)
MCHC: 30.9 g/dL (ref 30.0–36.0)
Monocytes Absolute: 0.5 10*3/uL (ref 0.1–1.0)
Monocytes Relative: 8 % (ref 3–12)
Neutro Abs: 4.2 10*3/uL (ref 1.7–7.7)
Neutrophils Relative %: 69 % (ref 43–77)
RBC: 4.32 MIL/uL (ref 3.87–5.11)

## 2012-10-12 LAB — FERRITIN: Ferritin: 10 ng/mL (ref 10–291)

## 2012-10-12 LAB — COMPREHENSIVE METABOLIC PANEL
ALT: 15 U/L (ref 0–35)
Albumin: 4.1 g/dL (ref 3.5–5.2)
CO2: 26 mEq/L (ref 19–32)
Calcium: 9.4 mg/dL (ref 8.4–10.5)
Chloride: 103 mEq/L (ref 96–112)
Glucose, Bld: 87 mg/dL (ref 70–99)
Potassium: 4.8 mEq/L (ref 3.5–5.3)
Sodium: 139 mEq/L (ref 135–145)
Total Bilirubin: 0.2 mg/dL — ABNORMAL LOW (ref 0.3–1.2)
Total Protein: 6.5 g/dL (ref 6.0–8.3)

## 2012-10-12 LAB — CK: Total CK: 74 U/L (ref 7–177)

## 2012-10-12 MED ORDER — ZOLPIDEM TARTRATE 10 MG PO TABS: 10.0000 mg | ORAL_TABLET | Freq: Every evening | ORAL | Status: AC | PRN

## 2012-10-12 MED ORDER — HYDROCODONE-ACETAMINOPHEN 5-300 MG PO TABS: 1.0000 | ORAL_TABLET | Freq: Four times a day (QID) | ORAL | Status: DC | PRN

## 2012-10-12 NOTE — Progress Notes (Signed)
Chief Complaint  Patient presents with  . Generalized Body Aches    x couple of weeks, not sleeping well. Is worrying why she is "losing blood" and she does not understand why. (I did pull paper chart ans she was rx'd xanax several times in the past)    Chart reviewed.  Her last CBC in January was done by GI, and Hg was back to normal.  At f/u it was recommended that she continue iron indefinitely.  She admits to stopping iron shortly after that visit due to constipation.  The Niferex that she is currently taking is not causing constipation, in fact she has been having some diarrhea.  Taking niferex twice daily.  She has been taking med now for 2-3 weeks, and isn't feeling any better.  She is aching all over, having headaches.  Using ibuprofen and tylenol.  "whole body" aches, keeps her awake, can't fall asleep until 6 am, when she is finally exhausted.  She has no energy.  Currently complaining of ache in low back and legs.  When she uses her arms they start to ache and fatigue easily.  Whole body hurts just when lying down.  First stool is normal, then watery stools 3-4 times/day.  Stools are dark/black since being on iron.  Denies any bloody stool.  Denies any abdominal pain or cramps.  Feeling "confused about everything", slightly worried, but denies feeling overly anxious.  Past Medical History  Diagnosis Date  . Hypertension   . Smoker     quit 11/1998  . Obesity   . GERD (gastroesophageal reflux disease)   . Anxiety associated with depression   . Dyslipidemia   . Diabetes mellitus 9/05    adult onset--diet controlled  . Anemia 2013    iron deficient  . Blood clotting disorder      ?DVT  . Diverticulosis 01/2012    L sided, seen on colonoscopy  . Hiatal hernia 01/2012    seen on EGD  . Restless leg syndrome    Past Surgical History  Procedure Laterality Date  . Partial hysterectomy  1990  . Cholecystectomy  1987  . Cardiovascular stress test      remote past  . Bladder  surgery  03/2008    bladder tack--unsuccessful per pt   . Colonoscopy  01/2012    Dr. Marina Goodell  . Esophagogastroduodenoscopy  01/2012    Dr. Marina Goodell   History   Social History  . Marital Status: Married    Spouse Name: N/A    Number of Children: 2  . Years of Education: N/A   Occupational History  . CNA     at [employer]   Social History Main Topics  . Smoking status: Former Smoker    Types: Cigarettes    Quit date: 02/26/2009  . Smokeless tobacco: Never Used  . Alcohol Use: Yes     Comment: rarely, 2 times a year maybe  . Drug Use: No  . Sexually Active: Not on file   Other Topics Concern  . Not on file   Social History Narrative  . No narrative on file   Current Outpatient Prescriptions on File Prior to Visit  Medication Sig Dispense Refill  . b complex vitamins tablet Take 1 tablet by mouth daily.      Marland Kitchen BLACK COHOSH PO Take by mouth.      Marland Kitchen buPROPion (WELLBUTRIN SR) 100 MG 12 hr tablet TAKE 1 TABLET BY MOUTH TWICE DAILY . TAKE EVENING DOSE NO  LATER THAN 5 PM.  180 tablet  0  . cholecalciferol (VITAMIN D) 1000 UNITS tablet Take 1,000 Units by mouth daily.      . Cimetidine (ACID REDUCER PO) Take 1 tablet by mouth 2 (two) times daily.      Marland Kitchen FLUoxetine (PROZAC) 40 MG capsule Take 1 capsule (40 mg total) by mouth daily.  90 capsule  1  . iron polysaccharides (NIFEREX) 150 MG capsule Take 1 capsule (150 mg total) by mouth 2 (two) times daily.  60 capsule  5  . lisinopril (PRINIVIL,ZESTRIL) 20 MG tablet Take 1 tablet (20 mg total) by mouth daily.  90 tablet  1  . magnesium oxide (MAG-OX) 400 MG tablet Take 400 mg by mouth 2 (two) times daily.      . Multiple Vitamins-Minerals (MULTIVITAMIN WITH MINERALS) tablet Take 1 tablet by mouth daily.      . Potassium 99 MG TABS Take 2 tablets by mouth daily.       . pravastatin (PRAVACHOL) 80 MG tablet Take 1 tablet (80 mg total) by mouth daily.  90 tablet  1  . SOY ISOFLAVONES PO Take 80 mg by mouth.      . vitamin E 400 UNIT  capsule Take 400 Units by mouth daily.       No current facility-administered medications on file prior to visit.   Allergies  Allergen Reactions  . Codeine Other (See Comments)    spacey  . Sulfa Antibiotics Itching   ROS:  See HPI.  +fatigue, insomnia, muscle aches.  No headaches, dizziness. No known sources of bleeding. Using otc ranitidine BID with good relief of heartburn.  Denies urinary complaints.  Ears feel plugged.  Denies runny nose, PND, cough.  No fevers.  Denies nausea, vomiting.    PHYSICAL EXAM: BP 130/80  Pulse 68  Ht 5\' 2"  (1.575 m)  Wt 187 lb (84.823 kg)  BMI 34.19 kg/m2  LMP 03/29/1989 Pleasant female in no distress HEENT:  PERRL, conjunctiva clear. OP clear.  TM's and EAC's normal. Neck: no lymphadenopathy, thyromegaly or mass Heart: regular rate and rhythm Lungs: clear bilaterally Abdomen: soft, nontender, no organomegaly or mass Extremities: no edema.  Muscles nontender Skin: no rashes Back: no spine or CVA tenderness.  Area of discomfort is in lower lumbar paraspinous muscles--nontender, and no spasm  ASSESSMENT/PLAN: Iron deficiency anemia, unspecified  Anemia, unspecified - Plan: CBC with Differential, Ferritin  Myalgia and myositis - Plan: Comprehensive metabolic panel, CK, Hydrocodone-Acetaminophen (VICODIN) 5-300 MG TABS  Encounter for long-term (current) use of other medications - Plan: Comprehensive metabolic panel  Insomnia - Plan: zolpidem (AMBIEN) 10 MG tablet  Her main complaints are that of pain (muscle aches), interfering with her sleep.  Therefore, will treat pain with pain med at bedtime.  Avoid NSAIDs due to her iron deficiency anemia.  Understands risks/side effects of narcotics.  If that is ineffective in helping her sleep, then can try [place].  Previously was ineffective for her, but that was when her RLS was severe and keeping her awake.  Advised of potential sedating factors of both meds, and not to use together unless they were  ineffective individually for her.   Release CBC, ferritin early, today, rather than returning next week, along with other labs for today.  She is quite anxious about the possibility of leukemia, given her father's hx, and she was reassured--labs show iron deficiency anemia, no other abnormalities on CBC  Myalgias--?etiology, if related to her anemia or not. Hold pravastatin  for 1-2 weeks to see if myalgias improve. Restart after 2 weeks.  If symptoms resolve off statin, and recur after restarting call us for change in lipid-lowering therapy.  Pt and husband state understanding.  F/u as scheduled with Dr. Marina Goodell Will send copies of labs to him

## 2012-10-12 NOTE — Patient Instructions (Signed)
Continue the iron.  We will be in touch with blood test results tomorrow, and they will be sent to Dr. Marina Goodell.  Stop the pravastatin for 1-2 weeks, to see if it could be contributing to your muscle aches.  If no improvement, restart the medication. If it seems better off the medication, still restart it, but if symptoms recur (muscle aches recur), then let us know and we can see about changing the cholesterol medication.  We need to try and get you to sleep, as you will feel better if you can catch up on your rest.  Since pain is a contributing factor and I don't want you taking any aspirin, ibuprofen, Goody or BC powder (only acetaminophen/tylenol for pain), I will also give you hydrocodone prescription that you can try taking at bedtime.  Perhaps if you hurt less, you will be able to fall asleep (plus the hydrocodone can be a little sedating).  If this doesn't help you sleep, then try the ambien.  Don't use both together unless you've tried them alone and neither of them help

## 2012-10-13 ENCOUNTER — Encounter: Payer: Self-pay | Admitting: Family Medicine

## 2012-10-13 NOTE — Progress Notes (Signed)
Quick Note:  CHANDRA ADVISED PT OF LABS I SENT LABS TO DR.PERRY ______

## 2012-10-23 ENCOUNTER — Telehealth: Payer: Self-pay | Admitting: *Deleted

## 2012-10-23 NOTE — Telephone Encounter (Signed)
Patient's husband, [name] called and states that [name] is not needing to take "sleeping pills." She had been taking the vicodin 5/300 and since this is helping with the pain she can actually sleep. Requesting a refill on Vicodin 5/300.

## 2012-10-23 NOTE — Telephone Encounter (Signed)
Patient's husband informed

## 2012-10-23 NOTE — Telephone Encounter (Signed)
This was not intended for longterm, nightly use, especially without a known diagnosis of what is causing pain.  It was to help short-term give her some relief and get some sleep.  She can try some extra strength tylenol or Tylenol Arthritis at bedtime to see if that gives her any relief.  I do not want her on narcotics nightly without [place] being able to figure out etiology.  If related to anemia, it should be improving. Deny refill request

## 2012-10-27 ENCOUNTER — Other Ambulatory Visit: Payer: Self-pay

## 2012-10-30 ENCOUNTER — Ambulatory Visit: Payer: BC Managed Care – PPO | Admitting: Internal Medicine

## 2012-12-26 ENCOUNTER — Other Ambulatory Visit: Payer: Self-pay | Admitting: Family Medicine

## 2013-02-08 ENCOUNTER — Telehealth: Payer: Self-pay | Admitting: Internal Medicine

## 2013-02-08 DIAGNOSIS — E7800 Pure hypercholesterolemia, unspecified: Secondary | ICD-10-CM

## 2013-02-08 DIAGNOSIS — D509 Iron deficiency anemia, unspecified: Secondary | ICD-10-CM

## 2013-02-08 DIAGNOSIS — I10 Essential (primary) hypertension: Secondary | ICD-10-CM

## 2013-02-08 DIAGNOSIS — E119 Type 2 diabetes mellitus without complications: Secondary | ICD-10-CM

## 2013-02-08 DIAGNOSIS — D649 Anemia, unspecified: Secondary | ICD-10-CM

## 2013-02-08 NOTE — Telephone Encounter (Signed)
Pt has a physical coming up next week and wanted to know if she needed to come in sooner for labs. If so please put orders in and call pt

## 2013-02-08 NOTE — Telephone Encounter (Signed)
Future orders entered. Please schedule fasting lab visit

## 2013-02-08 NOTE — Telephone Encounter (Signed)
Pt is coming in in the morning

## 2013-02-09 ENCOUNTER — Other Ambulatory Visit: Payer: No Typology Code available for payment source

## 2013-02-09 DIAGNOSIS — E119 Type 2 diabetes mellitus without complications: Secondary | ICD-10-CM

## 2013-02-09 DIAGNOSIS — E7800 Pure hypercholesterolemia, unspecified: Secondary | ICD-10-CM

## 2013-02-09 DIAGNOSIS — I10 Essential (primary) hypertension: Secondary | ICD-10-CM

## 2013-02-09 DIAGNOSIS — D509 Iron deficiency anemia, unspecified: Secondary | ICD-10-CM

## 2013-02-09 LAB — COMPREHENSIVE METABOLIC PANEL
AST: 16 U/L (ref 0–37)
Albumin: 4.4 g/dL (ref 3.5–5.2)
Alkaline Phosphatase: 72 U/L (ref 39–117)
BUN: 12 mg/dL (ref 6–23)
CO2: 27 mEq/L (ref 19–32)
Calcium: 9.6 mg/dL (ref 8.4–10.5)
Chloride: 103 mEq/L (ref 96–112)
Creat: 0.65 mg/dL (ref 0.50–1.10)
Potassium: 4.1 mEq/L (ref 3.5–5.3)
Total Protein: 7.2 g/dL (ref 6.0–8.3)

## 2013-02-09 LAB — LIPID PANEL
Cholesterol: 247 mg/dL — ABNORMAL HIGH (ref 0–200)
HDL: 54 mg/dL (ref >39)
LDL Cholesterol: 160 mg/dL — ABNORMAL HIGH (ref 0–99)
Total CHOL/HDL Ratio: 4.6 Ratio
VLDL: 33 mg/dL (ref 0–40)

## 2013-02-09 LAB — CBC WITH DIFFERENTIAL/PLATELET
Basophils Relative: 1 % (ref 0–1)
Eosinophils Absolute: 0.5 10*3/uL (ref 0.0–0.7)
Eosinophils Relative: 8 % — ABNORMAL HIGH (ref 0–5)
HCT: 39.7 % (ref 36.0–46.0)
Lymphocytes Relative: 30 % (ref 12–46)
Lymphs Abs: 1.9 10*3/uL (ref 0.7–4.0)
MCH: 25.9 pg — ABNORMAL LOW (ref 26.0–34.0)
MCV: 78.6 fL (ref 78.0–100.0)
Monocytes Absolute: 0.6 10*3/uL (ref 0.1–1.0)
Neutro Abs: 3.2 10*3/uL (ref 1.7–7.7)
Neutrophils Relative %: 52 % (ref 43–77)
Platelets: 357 10*3/uL (ref 150–400)
RBC: 5.05 MIL/uL (ref 3.87–5.11)
RDW: 17.9 % — ABNORMAL HIGH (ref 11.5–15.5)
WBC: 6.3 10*3/uL (ref 4.0–10.5)

## 2013-02-10 LAB — MICROALBUMIN / CREATININE URINE RATIO
Creatinine, Urine: 34.7 mg/dL
Microalb Creat Ratio: 14.4 mg/g (ref 0.0–30.0)

## 2013-02-14 ENCOUNTER — Encounter: Payer: Self-pay | Admitting: Family Medicine

## 2013-02-14 ENCOUNTER — Ambulatory Visit (INDEPENDENT_AMBULATORY_CARE_PROVIDER_SITE_OTHER): Payer: BC Managed Care – PPO | Admitting: Family Medicine

## 2013-02-14 VITALS — BP 136/84 | HR 64 | Ht 62.0 in | Wt 183.0 lb

## 2013-02-14 DIAGNOSIS — Z23 Encounter for immunization: Secondary | ICD-10-CM

## 2013-02-14 DIAGNOSIS — F411 Generalized anxiety disorder: Secondary | ICD-10-CM

## 2013-02-14 DIAGNOSIS — E7800 Pure hypercholesterolemia, unspecified: Secondary | ICD-10-CM

## 2013-02-14 DIAGNOSIS — R197 Diarrhea, unspecified: Secondary | ICD-10-CM

## 2013-02-14 DIAGNOSIS — G4700 Insomnia, unspecified: Secondary | ICD-10-CM

## 2013-02-14 DIAGNOSIS — K449 Diaphragmatic hernia without obstruction or gangrene: Secondary | ICD-10-CM

## 2013-02-14 DIAGNOSIS — K219 Gastro-esophageal reflux disease without esophagitis: Secondary | ICD-10-CM

## 2013-02-14 DIAGNOSIS — F329 Major depressive disorder, single episode, unspecified: Secondary | ICD-10-CM

## 2013-02-14 DIAGNOSIS — D509 Iron deficiency anemia, unspecified: Secondary | ICD-10-CM

## 2013-02-14 DIAGNOSIS — R82998 Other abnormal findings in urine: Secondary | ICD-10-CM

## 2013-02-14 DIAGNOSIS — Z0000 Encounter for general adult medical examination without abnormal findings: Secondary | ICD-10-CM

## 2013-02-14 DIAGNOSIS — E119 Type 2 diabetes mellitus without complications: Secondary | ICD-10-CM

## 2013-02-14 DIAGNOSIS — G2581 Restless legs syndrome: Secondary | ICD-10-CM

## 2013-02-14 DIAGNOSIS — J309 Allergic rhinitis, unspecified: Secondary | ICD-10-CM | POA: Insufficient documentation

## 2013-02-14 DIAGNOSIS — I10 Essential (primary) hypertension: Secondary | ICD-10-CM

## 2013-02-14 LAB — POCT URINALYSIS DIPSTICK
Bilirubin, UA: NEGATIVE
Blood, UA: NEGATIVE
Glucose, UA: NEGATIVE
Ketones, UA: NEGATIVE
Nitrite, UA: POSITIVE
Protein, UA: NEGATIVE
Spec Grav, UA: 1.01
Urobilinogen, UA: NEGATIVE
pH, UA: 5

## 2013-02-14 LAB — POCT GLYCOSYLATED HEMOGLOBIN (HGB A1C): Hemoglobin A1C: 5.7

## 2013-02-14 MED ORDER — ATORVASTATIN CALCIUM 10 MG PO TABS: 10.0000 mg | ORAL_TABLET | Freq: Every day | ORAL | Status: DC

## 2013-02-14 MED ORDER — LISINOPRIL 20 MG PO TABS: 20.0000 mg | ORAL_TABLET | Freq: Every day | ORAL | Status: DC

## 2013-02-14 MED ORDER — FLUOXETINE HCL 40 MG PO CAPS: 40.0000 mg | ORAL_CAPSULE | Freq: Every day | ORAL | Status: DC

## 2013-02-14 MED ORDER — ZOLPIDEM TARTRATE 10 MG PO TABS: 10.0000 mg | ORAL_TABLET | Freq: Every evening | ORAL | Status: DC | PRN

## 2013-02-14 MED ORDER — BUPROPION HCL ER (SR) 100 MG PO TB12: ORAL_TABLET | ORAL | Status: DC

## 2013-02-14 NOTE — Patient Instructions (Addendum)
HEALTH MAINTENANCE RECOMMENDATIONS:  It is recommended that you get at least 30 minutes of aerobic exercise at least 5 days/week (for weight loss, you may need as much as 60-90 minutes). This can be any activity that gets your heart rate up. This can be divided in 10-15 minute intervals if needed, but try and build up your endurance at least once a week.  Weight bearing exercise is also recommended twice weekly.  Eat a healthy diet with lots of vegetables, fruits and fiber.  "Colorful" foods have a lot of vitamins (ie green vegetables, tomatoes, red peppers, etc).  Limit sweet tea, regular sodas and alcoholic beverages, all of which has a lot of calories and sugar.  Up to 1 alcoholic drink daily may be beneficial for women (unless trying to lose weight, watch sugars).  Drink a lot of water.  Calcium recommendations are 1200-1500 mg daily (1500 mg for postmenopausal women or women without ovaries), and vitamin D 1000 IU daily.  This should be obtained from diet and/or supplements (vitamins), and calcium should not be taken all at once, but in divided doses.  Monthly self breast exams and yearly mammograms for women over the age of 39 is recommended.  Sunscreen of at least SPF 30 should be used on all sun-exposed parts of the skin when outside between the hours of 10 am and 4 pm (not just when at beach or pool, but even with exercise, golf, tennis, and yard work!)  Use a sunscreen that says "broad spectrum" so it covers both UVA and UVB rays, and make sure to reapply every 1-2 hours.  Remember to change the batteries in your smoke detectors when changing your clock times in the spring and fall.  Use your seat belt every time you are in a car, and please drive safely and not be distracted with cell phones and texting while driving.  Stop your iron. Stop the magnesium supplement. Start a multivitamin that contains iron.  Start the atorvastatin (cholesterol med)  Return stool studies.  If they are  normal, and diarrhea didn't improve with stopping the iron, then we can try Quest[place].  Return in 3 months for lab work and office visit afterwards  Allergies--consider daily medication such as claritin or zyrtec rather than benadryl which only lasts for 6 hours

## 2013-02-14 NOTE — Progress Notes (Addendum)
Chief Complaint  Patient presents with  . Annual Exam    nonfasting annual exam with pelvic. UA showed 1+ leuks, patient is symptomatic. Did not do eye exam she has one scheduled for Dec. Has had diarrhea since starting Poly-iron back in June 2014. External hemorrhoids have been flaring up and bleeding for years. Also has been havingn stomach pains/burning in her stomach over the last few months, started taking a pro-biotic about a month ago, has not noticed any difference.    Natasha Owens is a 52 y.o. female who presents for a complete physical, as well as follow up on her chronic medical problems.  She had labs done prior to her visit.  She has the following concerns:  She is complaining of diarrhea since she started the poly-iron about 4-5 months ago. She gets diarrhea after eating, drinking.  She can't even have intercourse due to diarrhea.  Hemorrhoids are flaring/bleeding from wiping so often. Previously had constipation on OTC iron supplements, diarrhea started after taking the rx iron supplement.  Has been compliant in taking iron daily (has NOT tried holding the med to see if diarrhea improved).  Taking imodium twice daily, and doesn't seem to help very much. Her stools look black, and sometimes mucusy.  She does not have any f/u scheduled with GI.  Has been taking probiotics for about a month without benefit.  She has very little dairy in her diet. Denies abdominal pain.  Diabetes--off medications.  Sugars have been running <100. Denies numbness/tingling or skin lesions or sores. Ophtho is UTD, due to schedule again.  Hypertension follow-up: Blood pressures are not checked elsewhere. Denies dizziness, headaches, chest pain. Denies side effects of medications.   Hyperlipidemia follow-up: She hasn't been taking pravastatin.  Leg cramps stopped when she was off pravastatin, and recurred when she restarted.  Hot flashes--much improved since on the black cohosh.   RLS--only rarely  symptomatic, and tylenol helps.  Depression and anxiety--well controlled on current regimen.  HH/GERD--doing well on zantac but has some burning in the stomach recently.  No dysphagia, heartburn  Insomnia--needs ambien about twice a week.  Immunization History  Administered Date(s) Administered  . DTaP 09/19/1997, 09/18/2004  . Hepatitis B 06/18/2003, 07/19/2003, 01/17/2004  . Influenza Split 01/13/2012, 12/27/2012  . Influenza Whole 02/13/2002, 02/09/2005, 01/10/2008  . Pneumococcal Conjugate 09/18/2004  (had dT in 6/99, 6/06, NOT DTaP as abstracted--will correct in chart).  Hasn't had TdaP Last Pap smear: s/p hysterectomy (for benign reasons, heavy bleeding) Last mammogram: 06/2012 Last colonoscopy: 01/2102 Last DEXA: never Dentist: "I don't go", but reports having 2 teeth that need to be pulled Ophtho:  Due; goes yearly Exercise: none--limited due to diarrhea  Past Medical History  Diagnosis Date  . Hypertension   . Smoker     quit 11/1998  . Obesity   . GERD (gastroesophageal reflux disease)   . Anxiety associated with depression   . Dyslipidemia   . Diabetes mellitus 9/05    adult onset--diet controlled  . Anemia 2013    iron deficient  . Blood clotting disorder      ?DVT  . Diverticulosis 01/2012    L sided, seen on colonoscopy  . Hiatal hernia 01/2012    seen on EGD  . Restless leg syndrome     Past Surgical History  Procedure Laterality Date  . Partial hysterectomy  1990  . Cholecystectomy  1987  . Cardiovascular stress test      remote past  . Bladder surgery  03/2008    bladder tack--unsuccessful per pt   . Colonoscopy  01/2012    Dr. Marina Goodell  . Esophagogastroduodenoscopy  01/2012    Dr. Marina Goodell    History   Social History  . Marital Status: Married    Spouse Name: N/A    Number of Children: 2  . Years of Education: N/A   Occupational History  . CNA     at [employer]   Social History Main Topics  . Smoking status: Former Smoker    Types:  Cigarettes    Quit date: 02/26/2009  . Smokeless tobacco: Never Used  . Alcohol Use: Yes     Comment: 2 drinks per week.   . Drug Use: No  . Sexual Activity: Not on file   Other Topics Concern  . Not on file   Social History Narrative  . No narrative on file    Family History  Problem Relation Age of Onset  . Diabetes Mother   . Heart disease Father     CHF  . Lymphoma Father   . Lung cancer Father   . Diabetes Sister   . Stroke Sister   . Dementia Sister   . Heart disease Sister 69    died of MI  . Hypertension Brother   . Sleep apnea Brother   . Sleep apnea Brother   . Colon cancer Neg Hx   . Colon polyps Neg Hx   . Breast cancer Neg Hx     Current outpatient prescriptions:b complex vitamins tablet, Take 1 tablet by mouth daily., Disp: , Rfl: ;  BLACK COHOSH PO, Take by mouth., Disp: , Rfl: ;  buPROPion (WELLBUTRIN SR) 100 MG 12 hr tablet, TAKE 1 TABLET BY MOUTH TWICE DAILY. TAKE EVENING DOSE NO LATER THAN 5 PM., Disp: 180 tablet, Rfl: 3;  CALCIUM-MAGNESIUM-ZINC PO, Take 1 tablet by mouth daily., Disp: , Rfl:  cholecalciferol (VITAMIN D) 1000 UNITS tablet, Take 1,000 Units by mouth daily., Disp: , Rfl: ;  diphenhydrAMINE (BENADRYL) 25 MG tablet, Take 50 mg by mouth daily., Disp: , Rfl: ;  FLUoxetine (PROZAC) 40 MG capsule, Take 1 capsule (40 mg total) by mouth daily., Disp: 90 capsule, Rfl: 3;  lisinopril (PRINIVIL,ZESTRIL) 20 MG tablet, Take 1 tablet (20 mg total) by mouth daily., Disp: 90 tablet, Rfl: 1 magnesium 30 MG tablet, Take 30 mg by mouth daily., Disp: , Rfl: ;  Multiple Vitamins-Minerals (MULTIVITAMIN WITH MINERALS) tablet, Take 1 tablet by mouth daily., Disp: , Rfl: ;  Polysaccharide Iron Complex (POLY-IRON 150 PO), Take 1 capsule by mouth 2 (two) times daily., Disp: , Rfl: ;  Probiotic Product (PROBIOTIC DAILY PO), Take 1 tablet by mouth daily., Disp: , Rfl:  ranitidine (ZANTAC) 150 MG tablet, Take 150 mg by mouth 2 (two) times daily., Disp: , Rfl: ;  SOY  ISOFLAVONES PO, Take 80 mg by mouth., Disp: , Rfl: ;  zolpidem (AMBIEN) 10 MG tablet, Take 1 tablet (10 mg total) by mouth at bedtime as needed for sleep., Disp: 30 tablet, Rfl: 0;  atorvastatin (LIPITOR) 10 MG tablet, Take 1 tablet (10 mg total) by mouth daily., Disp: 30 tablet, Rfl: 5 pravastatin (PRAVACHOL) 80 MG tablet, Take 1 tablet (80 mg total) by mouth daily., Disp: 90 tablet, Rfl: 1  Allergies  Allergen Reactions  . Codeine Other (See Comments)    spacey  . Sulfa Antibiotics Itching   ROS:  The patient denies anorexia, fever, weight changes, headaches,  vision changes, decreased hearing, ear  pain, sore throat, breast concerns, chest pain, palpitations, dizziness, syncope, dyspnea on exertion, cough, swelling, nausea, vomiting, constipation, abdominal pain, melena, hematochezia, indigestion/heartburn, hematuria, incontinence, dysuria, vaginal bleeding, discharge, odor or itch, genital lesions, joint pains, numbness, tingling, weakness, tremor, suspicious skin lesions, depression, anxiety, abnormal bleeding/bruising, or enlarged lymph nodes.  Allergies are controlled with benadryl R low back pain when sleeping, not with work/activity Burning in stomach Diarrhea Hot flashes are tolerable  PHYSICAL EXAM: BP 144/86  Pulse 64  Ht 5\' 2"  (1.575 m)  Wt 183 lb (83.008 kg)  BMI 33.46 kg/m2  LMP 03/29/1989 136/84 on repeat by MD General Appearance:    Alert, cooperative, no distress, appears stated age  Head:    Normocephalic, without obvious abnormality, atraumatic  Eyes:    PERRL, conjunctiva/corneas clear, EOM's intact, fundi    benign  Ears:    Normal TM's and external ear canals  Nose:   Nares normal, mucosa normal, mild edema with clear mucus.  No erythema or sinus tenderness  Throat:   Lips, mucosa, and tongue normal; teeth and gums normal  Neck:   Supple, no lymphadenopathy;  thyroid:  no   enlargement/tenderness/nodules; no carotid   bruit or JVD  Back:    Spine nontender, no  curvature, ROM normal, no CVA     Tenderness. Area of discomfort is right lower lumbar paraspinous area--nontender on exam no spasm  Lungs:     Clear to auscultation bilaterally without wheezes, rales or     ronchi; respirations unlabored  Chest Wall:    No tenderness or deformity   Heart:    Regular rate and rhythm, S1 and S2 normal, no murmur, rub   or gallop  Breast Exam:    No tenderness, masses, or nipple discharge or inversion.      No axillary lymphadenopathy  Abdomen:     Soft, non-tender, nondistended, normoactive bowel sounds,    no masses, no hepatosplenomegaly  Genitalia:    Normal external genitalia without lesions.  BUS and vagina normal; No abnormal vaginal discharge.  Uterus is surgically absent.  Adnexa is not enlarged, nontender, no masses.  Pap not performed  Rectal:    Normal tone, no masses or tenderness; nontender external hemorrhoid.  No stool in rectal vault for heme testing.  Extremities:   No clubbing, cyanosis or edema  Pulses:   2+ and symmetric all extremities  Skin:   Skin color, texture, turgor normal, no rashes or lesions  Lymph nodes:   Cervical, supraclavicular, and axillary nodes normal  Neurologic:   CNII-XII intact, normal strength, sensation and gait; reflexes 2+ and symmetric throughout          Psych:   Normal mood, affect, hygiene and grooming.    Urine dip: nitrite +  Lab Results  Component Value Date   HGBA1C 5.7 02/14/2013   Lab Results  Component Value Date   WBC 6.3 02/09/2013   HGB 13.1 02/09/2013   HCT 39.7 02/09/2013   MCV 78.6 02/09/2013   PLT 357 02/09/2013     Chemistry      Component Value Date/Time   NA 137 02/09/2013 0851   K 4.1 02/09/2013 0851   CL 103 02/09/2013 0851   CO2 27 02/09/2013 0851   BUN 12 02/09/2013 0851   CREATININE 0.65 02/09/2013 0851   CREATININE 0.68 04/16/2008 0700      Component Value Date/Time   CALCIUM 9.6 02/09/2013 0851   ALKPHOS 72 02/09/2013 0851   AST 16 02/09/2013 0851  ALT 15 02/09/2013  0851   BILITOT 0.3 02/09/2013 0851     Lab Results  Component Value Date   FERRITIN 20 02/09/2013   Lab Results  Component Value Date   CHOL 247* 02/09/2013   HDL 54 02/09/2013   LDLCALC 160* 02/09/2013   TRIG 167* 02/09/2013   CHOLHDL 4.6 02/09/2013   Normal urine microalbumin/Cr ratio  ASSESSMENT/PLAN:  Routine general medical examination at a health care facility - Plan: POCT Urinalysis Dipstick  Type II or unspecified type diabetes mellitus without mention of complication, not stated as uncontrolled - diet controlled - Plan: HgB A1c, Glucose, random  Restless leg syndrome - improved since anemia treated  Pure hypercholesterolemia - pravastatin caused leg cramps.  previously tolerated lipitor.  start at 10mg , recheck labs 3 mos, and increase dose if not at goal (LDL<100) - Plan: atorvastatin (LIPITOR) 10 MG tablet, Lipid panel, Hepatic function panel  Iron deficiency anemia, unspecified - improved/resolved. stop iron (?causing diarrhea) and recheck CBC and ferritin in 3 mos - Plan: CBC with Differential, Ferritin  Hiatal hernia  GERD (gastroesophageal reflux disease)  Essential hypertension, benign - borderline today.  check periodically elsewhere.  daily exercise, weight loss, low sodium diet - Plan: lisinopril (PRINIVIL,ZESTRIL) 20 MG tablet  Chronic depression - well controlled - Plan: buPROPion (WELLBUTRIN SR) 100 MG 12 hr tablet  Anxiety state, unspecified - well controlled - Plan: FLUoxetine (PROZAC) 40 MG capsule  Diarrhea - r/o infectious etiology (kit for stool studies given).  d/c Mg and iron  Insomnia - improved overall; continue prn zolpidem, sporadic use - Plan: zolpidem (AMBIEN) 10 MG tablet  Need for Tdap vaccination - Plan: Tdap vaccine greater than or equal to 7yo IM  Leukocytes in urine - Plan: CULTURE, URINE COMPREHENSIVE  Allergic rhinitis, cause unspecified - consider once daily medication such as claritin or zyrtec, vs more frequent dosing of  benadryl prn (which doesn't seem to be sedating for her).   DM--diet controlled Anemia--resolved.  Ferritin is WNL and she isn't tolerating the iron due to diarrhea.  Okay to stop her iron supplement.  Declines OTC iron due to h/o constipation.  Recommended MVI with iron once daily.  Diarrhea--check stool studies.  If negative and symptoms don't improve after stopping the iron supplement, consider trial of bile acid salts (ie Questran; pt is s/p cholecystectomy)  Hyperlipidemia:  Intolerant of pravastatin due to leg cramps.  She previously took lipitor, but was afraid of the side effects and stopped it in past. Reviewed risks/benefits/side effect of med (she tolerated in past) and she is willing to restart. Start atorvastatin 10mg  daily.  Recheck liver and lipids in 3 months, and if tolerating but not at goal of LDL<100, then plan to increase dose  Discussed monthly self breast exams and yearly mammograms after the age of 60; at least 30 minutes of aerobic activity at least 5 days/week; proper sunscreen use reviewed; healthy diet, including goals of calcium and vitamin D intake and alcohol recommendations (less than or equal to 1 drink/day) reviewed; regular seatbelt use; changing batteries in smoke detectors.  Immunization recommendations discussed--TdaP today.  Colonoscopy recommendations reviewed, UTD.  May need to refer back to GI if diarrhea persists  Stop Mg supplement.

## 2013-02-15 NOTE — Addendum Note (Signed)
Addended by: Joselyn Arrow on: 02/15/2013 08:49 AM   Modules accepted: Orders

## 2013-02-17 LAB — CULTURE, URINE COMPREHENSIVE: Colony Count: >=100000

## 2013-02-19 ENCOUNTER — Other Ambulatory Visit: Payer: Self-pay | Admitting: *Deleted

## 2013-02-19 DIAGNOSIS — N390 Urinary tract infection, site not specified: Secondary | ICD-10-CM

## 2013-02-19 MED ORDER — NITROFURANTOIN MONOHYD MACRO 100 MG PO CAPS: 100.0000 mg | ORAL_CAPSULE | Freq: Two times a day (BID) | ORAL | Status: DC

## 2013-02-21 ENCOUNTER — Telehealth: Payer: Self-pay | Admitting: *Deleted

## 2013-02-21 DIAGNOSIS — K529 Noninfective gastroenteritis and colitis, unspecified: Secondary | ICD-10-CM

## 2013-02-21 MED ORDER — CHOLESTYRAMINE LIGHT 4 G PO PACK: 4.0000 g | PACK | Freq: Two times a day (BID) | ORAL | Status: AC

## 2013-02-21 NOTE — Telephone Encounter (Signed)
Patient advised that stool studies were negative. Her diarrhea has only gotten slightly better since stopping the iron. Would like to know what the next step is if it does not completely resolve.

## 2013-02-21 NOTE — Telephone Encounter (Signed)
Patient's husband called back and Natasha Owens would like to try Questran.

## 2013-02-21 NOTE — Telephone Encounter (Signed)
Left message informing them that Questran was the next step that was discussed at visit and to call back if this is something she desired to start.

## 2013-02-21 NOTE — Telephone Encounter (Signed)
rx was sent to her pharmacy

## 2013-02-21 NOTE — Telephone Encounter (Signed)
We had discussed at her visit a trial of Questran as the next step. Does she want to try?

## 2013-03-01 ENCOUNTER — Encounter: Payer: Self-pay | Admitting: Family Medicine

## 2013-03-07 ENCOUNTER — Telehealth: Payer: Self-pay | Admitting: *Deleted

## 2013-03-07 ENCOUNTER — Other Ambulatory Visit: Payer: Self-pay | Admitting: *Deleted

## 2013-03-07 MED ORDER — TRAMADOL HCL 50 MG PO TABS: 50.0000 mg | ORAL_TABLET | Freq: Four times a day (QID) | ORAL | Status: DC | PRN

## 2013-03-07 NOTE — Telephone Encounter (Signed)
This med isn't on her med list.  It looks like it hasn't been rx'd in a year.  Ok to call in #30 for prn use, but if having increasing/worsening pain, or regular/ongoing use of this pain med, will be asked to schedule OV.

## 2013-03-07 NOTE — Telephone Encounter (Signed)
Patient's husband called requesting a refill on tramadol 50mg  for his wife,Natasha Owens for her back pain. Wasn't sure if this could be called in or needs to be written, he will pick up if needed.

## 2013-05-11 ENCOUNTER — Other Ambulatory Visit: Payer: No Typology Code available for payment source

## 2013-05-11 DIAGNOSIS — E7800 Pure hypercholesterolemia, unspecified: Secondary | ICD-10-CM

## 2013-05-11 DIAGNOSIS — D509 Iron deficiency anemia, unspecified: Secondary | ICD-10-CM

## 2013-05-11 DIAGNOSIS — E119 Type 2 diabetes mellitus without complications: Secondary | ICD-10-CM

## 2013-05-11 LAB — LIPID PANEL
Cholesterol: 157 mg/dL (ref 0–200)
HDL: 45 mg/dL (ref >39)
LDL Cholesterol: 86 mg/dL (ref 0–99)
TRIGLYCERIDES: 129 mg/dL (ref <150)
Total CHOL/HDL Ratio: 3.5 Ratio
VLDL: 26 mg/dL (ref 0–40)

## 2013-05-11 LAB — CBC WITH DIFFERENTIAL/PLATELET
BASOS ABS: 0.1 10*3/uL (ref 0.0–0.1)
Basophils Relative: 1 % (ref 0–1)
EOS ABS: 0.7 10*3/uL (ref 0.0–0.7)
Eosinophils Relative: 8 % — ABNORMAL HIGH (ref 0–5)
HCT: 34.4 % — ABNORMAL LOW (ref 36.0–46.0)
Hemoglobin: 11.2 g/dL — ABNORMAL LOW (ref 12.0–15.0)
Lymphocytes Relative: 18 % (ref 12–46)
Lymphs Abs: 1.5 10*3/uL (ref 0.7–4.0)
MCH: 26 pg (ref 26.0–34.0)
MCHC: 32.6 g/dL (ref 30.0–36.0)
MCV: 79.8 fL (ref 78.0–100.0)
Monocytes Absolute: 0.5 10*3/uL (ref 0.1–1.0)
Monocytes Relative: 6 % (ref 3–12)
NEUTROS ABS: 5.3 10*3/uL (ref 1.7–7.7)
NEUTROS PCT: 67 % (ref 43–77)
Platelets: 425 10*3/uL — ABNORMAL HIGH (ref 150–400)
RBC: 4.31 MIL/uL (ref 3.87–5.11)
RDW: 14 % (ref 11.5–15.5)
WBC: 8.1 10*3/uL (ref 4.0–10.5)

## 2013-05-11 LAB — FERRITIN: FERRITIN: 17 ng/mL (ref 10–291)

## 2013-05-11 LAB — GLUCOSE, RANDOM: Glucose, Bld: 103 mg/dL — ABNORMAL HIGH (ref 70–99)

## 2013-05-11 LAB — HEPATIC FUNCTION PANEL
ALK PHOS: 75 U/L (ref 39–117)
ALT: 19 U/L (ref 0–35)
AST: 19 U/L (ref 0–37)
Albumin: 4.2 g/dL (ref 3.5–5.2)
Bilirubin, Direct: <0.1 mg/dL (ref 0.0–0.3)
TOTAL PROTEIN: 6.7 g/dL (ref 6.0–8.3)
Total Bilirubin: 0.3 mg/dL (ref 0.2–1.2)

## 2013-05-17 ENCOUNTER — Encounter: Payer: BC Managed Care – PPO | Admitting: Family Medicine

## 2013-05-21 ENCOUNTER — Ambulatory Visit (INDEPENDENT_AMBULATORY_CARE_PROVIDER_SITE_OTHER): Payer: No Typology Code available for payment source | Admitting: Family Medicine

## 2013-05-21 ENCOUNTER — Encounter: Payer: Self-pay | Admitting: Family Medicine

## 2013-05-21 VITALS — BP 110/80 | HR 80 | Ht 62.0 in | Wt 183.0 lb

## 2013-05-21 DIAGNOSIS — E119 Type 2 diabetes mellitus without complications: Secondary | ICD-10-CM

## 2013-05-21 DIAGNOSIS — R3915 Urgency of urination: Secondary | ICD-10-CM

## 2013-05-21 DIAGNOSIS — F411 Generalized anxiety disorder: Secondary | ICD-10-CM

## 2013-05-21 DIAGNOSIS — E7800 Pure hypercholesterolemia, unspecified: Secondary | ICD-10-CM

## 2013-05-21 DIAGNOSIS — D509 Iron deficiency anemia, unspecified: Secondary | ICD-10-CM

## 2013-05-21 DIAGNOSIS — K219 Gastro-esophageal reflux disease without esophagitis: Secondary | ICD-10-CM

## 2013-05-21 DIAGNOSIS — I10 Essential (primary) hypertension: Secondary | ICD-10-CM

## 2013-05-21 DIAGNOSIS — G4700 Insomnia, unspecified: Secondary | ICD-10-CM

## 2013-05-21 DIAGNOSIS — K648 Other hemorrhoids: Secondary | ICD-10-CM

## 2013-05-21 DIAGNOSIS — F5104 Psychophysiologic insomnia: Secondary | ICD-10-CM

## 2013-05-21 LAB — POCT URINALYSIS DIPSTICK
Bilirubin, UA: NEGATIVE
Blood, UA: NEGATIVE
Glucose, UA: NEGATIVE
Ketones, UA: NEGATIVE
Leukocytes, UA: NEGATIVE
Nitrite, UA: NEGATIVE
Protein, UA: NEGATIVE
Spec Grav, UA: 1.025
Urobilinogen, UA: NEGATIVE
pH, UA: 5

## 2013-05-21 MED ORDER — POLYSACCHARIDE IRON COMPLEX 150 MG PO CAPS: 150.0000 mg | ORAL_CAPSULE | Freq: Two times a day (BID) | ORAL | Status: DC

## 2013-05-21 MED ORDER — ZOLPIDEM TARTRATE ER 12.5 MG PO TBCR: 12.5000 mg | EXTENDED_RELEASE_TABLET | Freq: Every evening | ORAL | Status: DC | PRN

## 2013-05-21 MED ORDER — HYDROCORTISONE ACETATE 25 MG RE SUPP: 25.0000 mg | Freq: Two times a day (BID) | RECTAL | Status: DC | PRN

## 2013-05-21 NOTE — Progress Notes (Signed)
Chief Complaint  Patient presents with  . Hypertension    nonfasting med check, labs already done. Is having some urinary urgency-feels like pressure. Feels like she has to go and then when she goes there is not very much urine. (UA was negative) One afternoon around 3:00pm, she was working 3rd shift night before-awoke to a chest pain and it felt like someone was pulling her heart out of her chest, lasted about 30 minutes and never happened again.    Patient presents for 3 month follow-up on multiple chronic medical problems.  Hyperlipidemia:  Changed to atorvastatin 10mg  3 mos ago, after not tolerating pravastatin (leg cramps).  She is tolerating this without any leg cramps or other side effects.  Iron deficiency anemia:  Had resolved, and we were going to do a trial off iron supplements x 3 mos to see if she still needs.  Recent labs were OFF iron. She has had some bleeding hemorrhoids (with every bowel movement, sometimes with clots, other times very light bleeding).  She has not been using any OTC treatments for hemorrhoids.  She does some heavy lifting at work.   She is starting to feel more tired again.  Diarrhea:  Questran was prescribed the end of November.  Using it twice a day made her constipated, so she cut it back to taking it once daily, which controls her diarrhea well, without constipation.  If she doesn't take it at all, diarrhea recurs.  She had two recent flares of diarrhea related to viruses.  She had some diarrhea yesterday.  "I keep getting sick"--many ill exposures (both at work and her grandchildren).  Hypertension:  Compliant with medications.  Denies any headaches, dizziness.  After working third shift about 3 weeks ago, she woke up at 3pm with pain in her chest "like her heart was pulled out of her chest".  Denies tachycardia, palpitations.  She cannot describe in any other way, different form anything she ever felt.  Denies squeezing, sharp, or like elephant stepping on her.   She didn't move, just laid there, and after 30 minutes it resolved (or she fell asleep with it).  Denies any pain like that since then.  She denies any exertional chest pain.  Hot flashes--improved on black cohosh RLS--rare  Diabetes--diet controlled.  Last A1c 3 mos ago was 5.7.  HH/GERD:  Controlled with ranitidine 150mg  BID.  Denies dysphagia.  Insomnia:  She has needed to use the [place] more frequently recently, every day.  Can't seem to sleep if she doesn't take something.  Mind won't shut down. She has only been taking 1/2, to stretch it and make it last, and has to take tylenol along with it ("relaxes my body").   Depression/anxiety--controlled (other than as above/insomnia)  Back pain:  They called 12/10 for refill on tramadol, for prn use.  Back pain is related to lifting at work.  It is overall better, but still has some days where she has back pain.  The tramadol was effective.  Having some urinary urgency for a couple of days.  She had some diarrhea yesterday.  Denies dysuria, fevers, chills (had earlier in illness, resolved).    Past Medical History  Diagnosis Date  . Hypertension   . Smoker     quit 11/1998  . Obesity   . GERD (gastroesophageal reflux disease)   . Anxiety associated with depression   . Dyslipidemia   . Diabetes mellitus 9/05    adult onset--diet controlled  . Anemia 2013  iron deficient  . Blood clotting disorder      ?DVT  . Diverticulosis 01/2012    L sided, seen on colonoscopy  . Hiatal hernia 01/2012    seen on EGD  . Restless leg syndrome   . Internal hemorrhoids 01/2012   Past Surgical History  Procedure Laterality Date  . Partial hysterectomy  1990  . Cholecystectomy  1987  . Cardiovascular stress test      remote past  . Bladder surgery  03/2008    bladder tack--unsuccessful per pt   . Colonoscopy  01/2012    Dr. Henrene Pastor  . Esophagogastroduodenoscopy  01/2012    Dr. Henrene Pastor   History   Social History  . Marital Status: Married     Spouse Name: N/A    Number of Children: 2  . Years of Education: N/A   Occupational History  . CNA     at [employer]  . Smoking status: Former Smoker    Types: Cigarettes    Quit date: 02/26/2009  . Smokeless tobacco: Never Used  . Alcohol Use: Yes     Comment: 2 drinks per week.   . Drug Use: No  . Sexual Activity: Not on file   Other Topics Concern  . Not on file   Social History Narrative  . No narrative on file   Outpatient Encounter Prescriptions as of 05/21/2013  Medication Sig  . atorvastatin (LIPITOR) 10 MG tablet Take 1 tablet (10 mg total) by mouth daily.  Marland Kitchen b complex vitamins tablet Take 1 tablet by mouth daily.  Marland Kitchen BLACK COHOSH PO Take by mouth.  Marland Kitchen buPROPion (WELLBUTRIN SR) 100 MG 12 hr tablet TAKE 1 TABLET BY MOUTH TWICE DAILY. TAKE EVENING DOSE NO LATER THAN 5 PM.  . Calcium Carbonate Antacid 600 MG chewable tablet Chew 600 mg by mouth daily.  . Chlorpheniramine Maleate (ALLERGY PO) Take 1 tablet by mouth daily.  . cholecalciferol (VITAMIN D) 1000 UNITS tablet Take 2,000 Units by mouth daily.   . cholestyramine light (PREVALITE) 4 G packet Take 1 packet (4 g total) by mouth 2 (two) times daily.  Marland Kitchen FLUoxetine (PROZAC) 40 MG capsule Take 1 capsule (40 mg total) by mouth daily.  Marland Kitchen lisinopril (PRINIVIL,ZESTRIL) 20 MG tablet Take 1 tablet (20 mg total) by mouth daily.  . Multiple Vitamins-Minerals (MULTIVITAMIN WITH MINERALS) tablet Take 1 tablet by mouth daily.  . ranitidine (ZANTAC) 150 MG tablet Take 150 mg by mouth 2 (two) times daily.  . SOY ISOFLAVONES PO Take 80 mg by mouth.  . traMADol (ULTRAM) 50 MG tablet Take 1-2 tablets (50-100 mg total) by mouth every 6 (six) hours as needed.  . [DISCONTINUED] zolpidem (AMBIEN) 10 MG tablet Take 1 tablet (10 mg total) by mouth at bedtime as needed for sleep.  . hydrocortisone (ANUSOL-HC) 25 MG suppository Place 1 suppository (25 mg total) rectally 2 (two) times daily as needed for  hemorrhoids or itching.  . iron polysaccharides (NIFEREX) 150 MG capsule Take 1 capsule (150 mg total) by mouth 2 (two) times daily.  Marland Kitchen zolpidem (AMBIEN CR) 12.5 MG CR tablet Take 1 tablet (12.5 mg total) by mouth at bedtime as needed for sleep.  . [DISCONTINUED] CALCIUM-MAGNESIUM-ZINC PO Take 1 tablet by mouth daily.  . [DISCONTINUED] diphenhydrAMINE (BENADRYL) 25 MG tablet Take 50 mg by mouth daily.  . [DISCONTINUED] magnesium 30 MG tablet Take 30 mg by mouth daily.  . [DISCONTINUED] nitrofurantoin, macrocrystal-monohydrate, (MACROBID) 100 MG capsule  Take 1 capsule (100 mg total) by mouth 2 (two) times daily.  . [DISCONTINUED] Polysaccharide Iron Complex (POLY-IRON 150 PO) Take 1 capsule by mouth 2 (two) times daily.  . [DISCONTINUED] pravastatin (PRAVACHOL) 80 MG tablet Take 1 tablet (80 mg total) by mouth daily.  . [DISCONTINUED] Probiotic Product (PROBIOTIC DAILY PO) Take 1 tablet by mouth daily.   Allergies  Allergen Reactions  . Codeine Other (See Comments)    spacey  . Sulfa Antibiotics Itching   ROS:  Denies fevers, chills, URI symptoms (has some chronic allergies/congestion).  No headaches, dizziness.  Some fatigue and insomnia, per HPI.  Chest pain x 1 episode a few weeks ago per HPI.  Denies shortness of breath, edema, bleeding (other than hemorrhoidal).  See HPI for details.  PHYSICAL EXAM: BP 110/80  Pulse 80  Ht 5\' 2"  (1.575 m)  Wt 183 lb (83.008 kg)  BMI 33.46 kg/m2  LMP 03/29/1989 Well developed, pleasant female in no distress, accompanied by her husband HEENT:  PERRL, EOMI, conjunctiva clear.  OP clear Neck: no lymphadenopathy, thyromegaly or carotid bruit Heart: regular rate and rhythm without murmur Lungs: clear bilaterally Back: no spine or CVA tenderness, no SI tenderness, no muscle spasm Abdomen: soft, nontender, no organomegaly or mass.  No suprapubic tenderness Extremities: no edema Psych: normal mood, affect, hygiene, grooming, speech, eye contact Neuro:  alert and oriented.  Cranial nerves, gait normal Skin: no rashes, bruising  Urine dip (today).  TG 1.025, negative leuks, nitrite, protein, blood  Lab Results  Component Value Date   CHOL 157 05/11/2013   HDL 45 05/11/2013   LDLCALC 86 05/11/2013   TRIG 129 05/11/2013   CHOLHDL 3.5 05/11/2013   Lab Results  Component Value Date   ALT 19 05/11/2013   AST 19 05/11/2013   ALKPHOS 75 05/11/2013   BILITOT 0.3 05/11/2013   Glucose 103  Lab Results  Component Value Date   WBC 8.1 05/11/2013   HGB 11.2* 05/11/2013   HCT 34.4* 05/11/2013   MCV 79.8 05/11/2013   PLT 425* 05/11/2013   Lab Results  Component Value Date   IRON 26* 09/22/2012   TIBC 514* 09/22/2012   FERRITIN 17 05/11/2013   ASSESSMENT/PLAN:  Pure hypercholesterolemia - controlled, tolerating lipitor  Urinary urgency - urine dip indicates mild dehydration (concentrated), no evidence of infection; reassured - Plan: POCT Urinalysis Dipstick  Iron deficiency anemia, unspecified - mild; recurrent after stopping iron; ongoing bleeding hemorrhoids. restart iron - Plan: iron polysaccharides (NIFEREX) 150 MG capsule, CBC with Differential, Ferritin  Essential hypertension, benign - controlled  Type II or unspecified type diabetes mellitus without mention of complication, not stated as uncontrolled - controlled - Plan: Hemoglobin A1c  Anxiety state, unspecified - stable  GERD (gastroesophageal reflux disease) - controlled  Chronic insomnia - needing daily meds--change to [employer] CR - Plan: zolpidem (AMBIEN CR) 12.5 MG CR tablet  Internal hemorrhoid - Plan: hydrocortisone (ANUSOL-HC) 25 MG suppository   Insomnia:  Given need for nightly use, and that 1/2 tablet isn't effective, change to ambien CR 12.5mg  #30 x 2    F/u 3 mos with labs prior

## 2013-05-21 NOTE — Patient Instructions (Signed)
Continue all of your current medications except the ambien.  We are changing you to the controlled release form which is approved for longterm use, since you seem to be needing it every single day.    Restart the iron (niferex prescription was sent).

## 2013-05-25 ENCOUNTER — Other Ambulatory Visit: Payer: Self-pay | Admitting: Family Medicine

## 2013-05-25 NOTE — Telephone Encounter (Signed)
Last filled for #30 on 12/10.  This isn't a chronic med, but had a recent flare of back pain.  Okay to refill #30 with no add'l refill.  Thanks

## 2013-05-25 NOTE — Telephone Encounter (Signed)
IS THIS OKAY TO REFILL 

## 2013-06-15 ENCOUNTER — Other Ambulatory Visit: Payer: Self-pay | Admitting: Family Medicine

## 2013-06-18 NOTE — Telephone Encounter (Signed)
Is this okay to refill? 

## 2013-06-18 NOTE — Telephone Encounter (Signed)
Looks like she was given 12 a month ago.  If she continues to need it this frequently, we may need to refer her for eval.  Okay to refill this time, and offer referral for treatment if not resolving with this treatment

## 2013-08-03 ENCOUNTER — Other Ambulatory Visit: Payer: No Typology Code available for payment source

## 2013-08-03 DIAGNOSIS — E119 Type 2 diabetes mellitus without complications: Secondary | ICD-10-CM

## 2013-08-03 DIAGNOSIS — D509 Iron deficiency anemia, unspecified: Secondary | ICD-10-CM

## 2013-08-03 LAB — HEMOGLOBIN A1C
HEMOGLOBIN A1C: 6.4 % — AB (ref <5.7)
MEAN PLASMA GLUCOSE: 137 mg/dL — AB (ref <117)

## 2013-08-03 LAB — CBC WITH DIFFERENTIAL/PLATELET
BASOS ABS: 0.1 10*3/uL (ref 0.0–0.1)
Basophils Relative: 1 % (ref 0–1)
EOS PCT: 7 % — AB (ref 0–5)
Eosinophils Absolute: 0.5 10*3/uL (ref 0.0–0.7)
HCT: 35.4 % — ABNORMAL LOW (ref 36.0–46.0)
Hemoglobin: 11.6 g/dL — ABNORMAL LOW (ref 12.0–15.0)
Lymphocytes Relative: 18 % (ref 12–46)
Lymphs Abs: 1.2 10*3/uL (ref 0.7–4.0)
MCH: 24.9 pg — AB (ref 26.0–34.0)
MCHC: 32.8 g/dL (ref 30.0–36.0)
MCV: 76.1 fL — ABNORMAL LOW (ref 78.0–100.0)
Monocytes Absolute: 0.6 10*3/uL (ref 0.1–1.0)
Monocytes Relative: 9 % (ref 3–12)
NEUTROS ABS: 4.5 10*3/uL (ref 1.7–7.7)
NEUTROS PCT: 65 % (ref 43–77)
PLATELETS: 337 10*3/uL (ref 150–400)
RBC: 4.65 MIL/uL (ref 3.87–5.11)
RDW: 14.9 % (ref 11.5–15.5)
WBC: 6.9 10*3/uL (ref 4.0–10.5)

## 2013-08-04 LAB — FERRITIN: Ferritin: 9 ng/mL — ABNORMAL LOW (ref 10–291)

## 2013-08-13 ENCOUNTER — Ambulatory Visit (INDEPENDENT_AMBULATORY_CARE_PROVIDER_SITE_OTHER): Payer: No Typology Code available for payment source | Admitting: Family Medicine

## 2013-08-13 ENCOUNTER — Encounter: Payer: Self-pay | Admitting: Family Medicine

## 2013-08-13 VITALS — BP 112/72 | HR 68 | Ht 62.0 in | Wt 191.0 lb

## 2013-08-13 DIAGNOSIS — M545 Low back pain, unspecified: Secondary | ICD-10-CM

## 2013-08-13 DIAGNOSIS — F329 Major depressive disorder, single episode, unspecified: Secondary | ICD-10-CM

## 2013-08-13 DIAGNOSIS — E669 Obesity, unspecified: Secondary | ICD-10-CM

## 2013-08-13 DIAGNOSIS — I10 Essential (primary) hypertension: Secondary | ICD-10-CM

## 2013-08-13 DIAGNOSIS — E7800 Pure hypercholesterolemia, unspecified: Secondary | ICD-10-CM

## 2013-08-13 DIAGNOSIS — F411 Generalized anxiety disorder: Secondary | ICD-10-CM

## 2013-08-13 DIAGNOSIS — K219 Gastro-esophageal reflux disease without esophagitis: Secondary | ICD-10-CM

## 2013-08-13 DIAGNOSIS — K449 Diaphragmatic hernia without obstruction or gangrene: Secondary | ICD-10-CM

## 2013-08-13 DIAGNOSIS — D509 Iron deficiency anemia, unspecified: Secondary | ICD-10-CM

## 2013-08-13 DIAGNOSIS — G2581 Restless legs syndrome: Secondary | ICD-10-CM

## 2013-08-13 DIAGNOSIS — F3289 Other specified depressive episodes: Secondary | ICD-10-CM

## 2013-08-13 DIAGNOSIS — E119 Type 2 diabetes mellitus without complications: Secondary | ICD-10-CM

## 2013-08-13 DIAGNOSIS — D649 Anemia, unspecified: Secondary | ICD-10-CM

## 2013-08-13 MED ORDER — LISINOPRIL 20 MG PO TABS: 20.0000 mg | ORAL_TABLET | Freq: Every day | ORAL | Status: AC

## 2013-08-13 MED ORDER — ATORVASTATIN CALCIUM 10 MG PO TABS: 10.0000 mg | ORAL_TABLET | Freq: Every day | ORAL | Status: DC

## 2013-08-13 MED ORDER — OMEPRAZOLE 40 MG PO CPDR: 40.0000 mg | DELAYED_RELEASE_CAPSULE | Freq: Every day | ORAL | Status: DC

## 2013-08-13 MED ORDER — ETODOLAC 500 MG PO TABS: 500.0000 mg | ORAL_TABLET | Freq: Two times a day (BID) | ORAL | Status: AC | PRN

## 2013-08-13 NOTE — Progress Notes (Signed)
Chief Complaint  Patient presents with  . med check    pt denies any problems or concerns   She complains of back pain.  When she ran out of ultram (that she was taking for back pain) she found an old prescription for Lodine from a previous doctor. She has been taking that twice daily and it has helped with her back pain.  She denies any abdominal pain or bleeding.   She has black stools since taking iron.  She notices BRB after a bowel movement related to hemorrhoids--infrequently and small amount.  She has been using the [facility] suppository--feels like she should be using it daily, but doesn't only because she didn't have enough to use more often.  Only bleeds occasionally, not painful/itchy. She lifts people every day at work, which contributes to her back pain. Ultram didn't help. Tylenol doesn't help. She previously had PT.  Doesn't recall ever being given a home exercise program.  Iron deficiency anemia:  She has been compliant with taking iron.  Other than the mild hemorrhoidal bleeding, denies any sources of bleeding.    Diarrhea: Questran was prescribed the end of November. Using it once daily, which controls her diarrhea well, without constipation.   Hypertension: Compliant with medications. Denies any headaches, dizziness. She isn't checking BP elsewhere.  Denies headaches, dizziness, palpitations, no chest pain.   RLS--seems to be flaring now, symptoms nightly.  Diabetes--diet controlled.  Doesn't check blood sugars.  She hasn't changed her diet in any way.  She doesn't drink sweet tea or soda, avoids sweets.  She gained weight since she started eating more (admits that she really wasn't eating much at all previously).  She is eating healthy--vegetables, etc, and has gained weight.  Doesn't eat carbs.  She isn't getting any exercise.  HH/GERD: Controlled with ranitidine 150mg  BID. Denies dysphagia.   Insomnia: She reports that her schedule is weird (working 3rd shift, 11 hrs/day).  She  can't get right to bed, so ends up not having 8 hrs to sleep, so can't take sleep aid.  If she doesn't sleep full 8 hours, then she is too tired.  Grandchildren who live with her will wake her up.  She works 7 days/week--she enjoys working this much, as it gets her out of her crazy household  Depression/anxiety--controlled overall.  Stress related to living situation, happier when at work.  Hyperlipidemia: Remains on atorvastatin without complaints.    Past Medical History  Diagnosis Date  . Hypertension   . Smoker     quit 11/1998  . Obesity   . GERD (gastroesophageal reflux disease)   . Anxiety associated with depression   . Dyslipidemia   . Diabetes mellitus 9/05    adult onset--diet controlled  . Anemia 2013    iron deficient  . Blood clotting disorder      ?DVT  . Diverticulosis 01/2012    L sided, seen on colonoscopy  . Hiatal hernia 01/2012    seen on EGD  . Restless leg syndrome   . Internal hemorrhoids 01/2012   Past Surgical History  Procedure Laterality Date  . Partial hysterectomy  1990  . Cholecystectomy  1987  . Cardiovascular stress test      remote past  . Bladder surgery  03/2008    bladder tack--unsuccessful per pt   . Colonoscopy  01/2012    Dr. Henrene Pastor  . Esophagogastroduodenoscopy  01/2012    Dr. Henrene Pastor   History   Social History  . Marital Status:  Married    Spouse Name: N/A    Number of Children: 2  . Years of Education: N/A   Occupational History  . CNA     at [employer]  . Smoking status: Former Smoker    Types: Cigarettes    Quit date: 02/26/2009  . Smokeless tobacco: Never Used  . Alcohol Use: Yes     Comment: 2 drinks per week.   . Drug Use: No  . Sexual Activity: Not on file   Other Topics Concern  . Not on file   Social History Narrative  . No narrative on file   Outpatient Encounter Prescriptions as of 08/13/2013  Medication Sig Note  . ANUCORT-HC 25 MG suppository INSERT 1 RECTAL SUPPOSITORY  TWICE DAILY AS NEEDED FOR HEMORRHOIDS OR ITCHING 08/13/2013: Uses prn (would use more regularly if she had more)  . atorvastatin (LIPITOR) 10 MG tablet Take 1 tablet (10 mg total) by mouth daily.   Marland Kitchen b complex vitamins tablet Take 1 tablet by mouth daily.   Marland Kitchen BLACK COHOSH PO Take by mouth.   Marland Kitchen buPROPion (WELLBUTRIN SR) 100 MG 12 hr tablet TAKE 1 TABLET BY MOUTH TWICE DAILY. TAKE EVENING DOSE NO LATER THAN 5 PM.   . Calcium Carbonate Antacid 600 MG chewable tablet Chew 600 mg by mouth daily. 08/13/2013: Takes 1 daily (not chewable)  . cholecalciferol (VITAMIN D) 1000 UNITS tablet Take 2,000 Units by mouth daily.    . cholestyramine light (PREVALITE) 4 G packet Take 1 packet (4 g total) by mouth 2 (two) times daily. 05/21/2013: Using once daily with good results (diarrhea improved); had constipation with BID dosing  . etodolac (LODINE) 500 MG tablet Take 1 tablet (500 mg total) by mouth 2 (two) times daily as needed (pain).   Marland Kitchen FLUoxetine (PROZAC) 40 MG capsule Take 1 capsule (40 mg total) by mouth daily.   . iron polysaccharides (NIFEREX) 150 MG capsule Take 1 capsule (150 mg total) by mouth 2 (two) times daily.   Marland Kitchen lisinopril (PRINIVIL,ZESTRIL) 20 MG tablet Take 1 tablet (20 mg total) by mouth daily.   . Multiple Vitamins-Minerals (MULTIVITAMIN WITH MINERALS) tablet Take 1 tablet by mouth daily.   . SOY ISOFLAVONES PO Take 80 mg by mouth.   . zolpidem (AMBIEN CR) 12.5 MG CR tablet Take 1 tablet (12.5 mg total) by mouth at bedtime as needed for sleep.   . [DISCONTINUED] atorvastatin (LIPITOR) 10 MG tablet Take 1 tablet (10 mg total) by mouth daily.   . [DISCONTINUED] etodolac (LODINE) 500 MG tablet Take 500 mg by mouth 2 (two) times daily. 08/13/2013: She had an old prescription at home from previous doctor.  Taking it with good results for low back pain.   . [DISCONTINUED] lisinopril (PRINIVIL,ZESTRIL) 20 MG tablet Take 1 tablet (20 mg total) by mouth daily.   . [DISCONTINUED] ranitidine (ZANTAC) 150  MG tablet Take 150 mg by mouth 2 (two) times daily.   . Chlorpheniramine Maleate (ALLERGY PO) Take 1 tablet by mouth daily. 08/13/2013: Uses prn  . omeprazole (PRILOSEC) 40 MG capsule Take 1 capsule (40 mg total) by mouth daily.   . traMADol (ULTRAM) 50 MG tablet TAKE 1 TO 2 TABLETS BY MOUTH EVERY 6 HOURS AS NEEDED 08/13/2013: Ran out; not needing since she found and took an old prescription for etodolac   Allergies  Allergen Reactions  . Codeine Other (See Comments)    spacey  . Sulfa Antibiotics Itching  ROS: denies headaches, dizziness, fevers, chills, URI symptoms, cough, shortness of breath, chest pain, heartburn, dysphagia, nausea, vomiting.  Bowels are normal--no straining, diarrhea controlled with med.  Stools are black due to iron.  Only occasional hemorrhoidal bleeding, mild.  Denies vaginal bleeding, dysuria, rashes, bruising, or other complaints except as per HPI.  +LBP, not currently.  PHYSICAL EXAM: BP 112/72  Pulse 68  Ht 5\' 2"  (1.575 m)  Wt 191 lb (86.637 kg)  BMI 34.93 kg/m2  LMP 03/29/1989  Well developed female, obese.  Irritable today, although smiling at husband (pretty much laughing at advice/suggestions given during visit) Neck: no lymphadenopathy, thyromegaly or mass Heart: regular rate and rhythm Lungs: clear bilaterally Back:  No spinal tenderness.  No CVA tenderness. No muscle spasm.  Back is nontender Abdomen: soft, nontender, no organomegaly or mass Extremities: no edema.  2+ pulses.  Normal monofilament/diabetic foot exam Psych: normal hygiene, affect, grooming, mood Neuro: alert and oriented. Normal gait, strength, cranial nerves  Lab Results  Component Value Date   HGBA1C 6.4* 08/03/2013   (was 5.7 6 months ago).  Lab Results  Component Value Date   WBC 6.9 08/03/2013   HGB 11.6* 08/03/2013   HCT 35.4* 08/03/2013   MCV 76.1* 08/03/2013   PLT 337 08/03/2013   Ferritin 9 (low)  ASSESSMENT/PLAN:  Iron deficiency anemia, unspecified - ? etiology.   negative w/u in past.  known HH. Now on NSAIDs--rec changing from H2 to PPI and f/u with GI. consider iron infusion if not responding to oral  Type II or unspecified type diabetes mellitus without mention of complication, not stated as uncontrolled - she has gained weight, and A1c has increased.  She did not want to discuss restarting meds, made it clear she would refuse  Essential hypertension, benign - controlled - Plan: lisinopril (PRINIVIL,ZESTRIL) 20 MG tablet  Anxiety state, unspecified  Chronic depression  Pure hypercholesterolemia - at goal per labs 3 mos ago--continue meds and low chol diet - Plan: atorvastatin (LIPITOR) 10 MG tablet  Restless leg syndrome - increased symptoms, likely related to her iron deficiency anemia.  She had flare of RLS in past with IDA  Anemia - Plan: omeprazole (PRILOSEC) 40 MG capsule  Lumbago - muscular. reviewed proper lifting, need for stretching, core strengthening. risks of NSAIDs reviewed. She wants to continue as is more effective than other meds - Plan: etodolac (LODINE) 500 MG tablet  GERD (gastroesophageal reflux disease) - change from ranitidine to omeprazole for treatment of reflux/HH and for stomach protection from NSAIDs she is taking for her back  Hiatal hernia  Obesity (BMI 30-39.9) - she gets inadequate sleep, inadequate exercise. Reviewed recs for sleep, diet, exercise. Very resistant to listen to suggestions, don't "work" with her schedule  DM--isn't as well controlled with diet alone.  Declines restarting medication.  Encouraged more care with diet (re: snacks, etc); daily exercise  IDA--F/u with GI is encouraged.  Continue niferex. If not responding, may need iron infusion (through GI)  LBP-- Discussed proper lifting, need to strengthen core, stretches.  Consider referring back to PT (declines). Refilled etodolac--risks reviewed.  F/u 3 months Fasting labs prior  Total visit time was over 45 min, more than 1/2 spent  counseling

## 2013-08-13 NOTE — Patient Instructions (Signed)
Stop the ranitidine.  Take the omeprazole in its place. Consider taking some vitamin C along with the iron to help the absorption.  Try and cut out extra sugar from the diet.  minimum of 30 minutes of exercise is recommended daily.  Weight loss is strongly encouraged.  I'm not sure why you have re-developed an iron deficiency.  Your iron stores are dropping;  The hemoglobin did not increase as much as I expected with you taking the iron supplement.  I recommend following up with the gastroenterologist to discuss further.  The iron deficiency is likely contributing to your worsening restless leg syndrome.  If iron stores aren't coming up with the treatments, you might need to have an iron infusion (GI can arrange).

## 2013-08-31 ENCOUNTER — Other Ambulatory Visit: Payer: Self-pay | Admitting: Family Medicine

## 2013-08-31 NOTE — Telephone Encounter (Signed)
Ok to RF? 

## 2013-08-31 NOTE — Telephone Encounter (Signed)
done

## 2013-09-04 ENCOUNTER — Telehealth: Payer: Self-pay | Admitting: Family Medicine

## 2013-09-04 NOTE — Telephone Encounter (Signed)
She had labs checked a month ago, and it showed a mild anemia, but definite iron deficiency (ferritin was low).  She was asked to f/u with GI (and if she wasn't responding to the iron that she has been taking, their office could arrange for iron infusion).  Please have her schedule appt with her GI (doesn't need OV with me, just had one 2-3 weeks ago)

## 2013-09-04 NOTE — Telephone Encounter (Signed)
Pt's husband called and stated that Natasha Owens is feeling like her iron is low and would like to come in to have it checked. I did offer an appt but he states she usually comes in for labs. Please advise to clinical pool.

## 2013-09-04 NOTE — Telephone Encounter (Signed)
Left VM for pt notifying her of Dr.Knapp recommendations

## 2013-09-22 ENCOUNTER — Telehealth: Payer: Self-pay | Admitting: Family Medicine

## 2013-09-24 ENCOUNTER — Telehealth: Payer: Self-pay

## 2013-09-24 DIAGNOSIS — F5104 Psychophysiologic insomnia: Secondary | ICD-10-CM

## 2013-09-24 MED ORDER — ZOLPIDEM TARTRATE ER 12.5 MG PO TBCR: 12.5000 mg | EXTENDED_RELEASE_TABLET | Freq: Every evening | ORAL | Status: DC | PRN

## 2013-09-24 NOTE — Telephone Encounter (Signed)
Ok to refill #30 (looks like prior Josem Kaufmann is in process--see prior phone message).  Also--per last phone message (earlier in June), and per our last visit (May), she was told she needed to f/u with GI.  Please make sure this is arranged.

## 2013-09-24 NOTE — Telephone Encounter (Signed)
Refill called in for #30. Will call Wed to follow on GI.

## 2013-09-24 NOTE — Telephone Encounter (Signed)
This was sent to me.

## 2013-09-24 NOTE — Telephone Encounter (Signed)
Refill request for Zolpidem ER 12.5 #30

## 2013-09-25 NOTE — Telephone Encounter (Signed)
P.A. Approved for Zolpidem til 09/24/16, faxed pharmacy, left message for pt

## 2013-09-26 NOTE — Telephone Encounter (Signed)
Spoke with patient's husband and he stated that they have not scheduled GI appt and they don't think that she needs to schedule at the moment-he reports that patient is feeling much better. I offered to schedule for them, he declined.

## 2013-11-09 ENCOUNTER — Other Ambulatory Visit: Payer: No Typology Code available for payment source

## 2013-11-09 DIAGNOSIS — D509 Iron deficiency anemia, unspecified: Secondary | ICD-10-CM

## 2013-11-09 DIAGNOSIS — E7800 Pure hypercholesterolemia, unspecified: Secondary | ICD-10-CM

## 2013-11-09 DIAGNOSIS — E119 Type 2 diabetes mellitus without complications: Secondary | ICD-10-CM

## 2013-11-09 DIAGNOSIS — I10 Essential (primary) hypertension: Secondary | ICD-10-CM

## 2013-11-09 LAB — HEMOGLOBIN A1C
Hgb A1c MFr Bld: 5.9 % — ABNORMAL HIGH (ref <5.7)
MEAN PLASMA GLUCOSE: 123 mg/dL — AB (ref <117)

## 2013-11-09 LAB — COMPREHENSIVE METABOLIC PANEL
ALK PHOS: 47 U/L (ref 39–117)
ALT: 17 U/L (ref 0–35)
AST: 15 U/L (ref 0–37)
Albumin: 4.1 g/dL (ref 3.5–5.2)
BILIRUBIN TOTAL: 0.3 mg/dL (ref 0.2–1.2)
BUN: 12 mg/dL (ref 6–23)
CO2: 25 mEq/L (ref 19–32)
CREATININE: 0.65 mg/dL (ref 0.50–1.10)
Calcium: 8.8 mg/dL (ref 8.4–10.5)
Chloride: 103 mEq/L (ref 96–112)
Glucose, Bld: 109 mg/dL — ABNORMAL HIGH (ref 70–99)
Potassium: 4.3 mEq/L (ref 3.5–5.3)
Sodium: 139 mEq/L (ref 135–145)
Total Protein: 6.4 g/dL (ref 6.0–8.3)

## 2013-11-09 LAB — CBC WITH DIFFERENTIAL/PLATELET
BASOS ABS: 0.1 10*3/uL (ref 0.0–0.1)
Basophils Relative: 1 % (ref 0–1)
EOS PCT: 7 % — AB (ref 0–5)
Eosinophils Absolute: 0.4 10*3/uL (ref 0.0–0.7)
HCT: 38.7 % (ref 36.0–46.0)
HEMOGLOBIN: 12.7 g/dL (ref 12.0–15.0)
LYMPHS ABS: 1.1 10*3/uL (ref 0.7–4.0)
LYMPHS PCT: 17 % (ref 12–46)
MCH: 26.8 pg (ref 26.0–34.0)
MCHC: 32.8 g/dL (ref 30.0–36.0)
MCV: 81.8 fL (ref 78.0–100.0)
MONO ABS: 0.6 10*3/uL (ref 0.1–1.0)
MONOS PCT: 9 % (ref 3–12)
NEUTROS ABS: 4.2 10*3/uL (ref 1.7–7.7)
Neutrophils Relative %: 66 % (ref 43–77)
Platelets: 328 10*3/uL (ref 150–400)
RBC: 4.73 MIL/uL (ref 3.87–5.11)
RDW: 19.5 % — ABNORMAL HIGH (ref 11.5–15.5)
WBC: 6.4 10*3/uL (ref 4.0–10.5)

## 2013-11-09 LAB — LIPID PANEL
CHOL/HDL RATIO: 3.5 ratio
Cholesterol: 166 mg/dL (ref 0–200)
HDL: 48 mg/dL (ref >39)
LDL CALC: 90 mg/dL (ref 0–99)
TRIGLYCERIDES: 140 mg/dL (ref <150)
VLDL: 28 mg/dL (ref 0–40)

## 2013-11-09 LAB — IRON: Iron: 28 ug/dL — ABNORMAL LOW (ref 42–145)

## 2013-11-10 LAB — TSH: TSH: 0.506 u[IU]/mL (ref 0.350–4.500)

## 2013-11-10 LAB — MICROALBUMIN / CREATININE URINE RATIO
CREATININE, URINE: 149.3 mg/dL
Microalb Creat Ratio: 20.6 mg/g (ref 0.0–30.0)
Microalb, Ur: 3.08 mg/dL — ABNORMAL HIGH (ref 0.00–1.89)

## 2013-11-10 LAB — FERRITIN: Ferritin: 34 ng/mL (ref 10–291)

## 2013-11-19 ENCOUNTER — Encounter: Payer: Self-pay | Admitting: Family Medicine

## 2013-11-19 ENCOUNTER — Ambulatory Visit (INDEPENDENT_AMBULATORY_CARE_PROVIDER_SITE_OTHER): Payer: No Typology Code available for payment source | Admitting: Family Medicine

## 2013-11-19 VITALS — BP 120/86 | HR 72 | Ht 62.0 in | Wt 183.0 lb

## 2013-11-19 DIAGNOSIS — D509 Iron deficiency anemia, unspecified: Secondary | ICD-10-CM

## 2013-11-19 DIAGNOSIS — M545 Low back pain, unspecified: Secondary | ICD-10-CM

## 2013-11-19 DIAGNOSIS — G4700 Insomnia, unspecified: Secondary | ICD-10-CM

## 2013-11-19 DIAGNOSIS — F5104 Psychophysiologic insomnia: Secondary | ICD-10-CM

## 2013-11-19 DIAGNOSIS — F3289 Other specified depressive episodes: Secondary | ICD-10-CM

## 2013-11-19 DIAGNOSIS — Z23 Encounter for immunization: Secondary | ICD-10-CM

## 2013-11-19 DIAGNOSIS — F329 Major depressive disorder, single episode, unspecified: Secondary | ICD-10-CM

## 2013-11-19 DIAGNOSIS — K219 Gastro-esophageal reflux disease without esophagitis: Secondary | ICD-10-CM

## 2013-11-19 DIAGNOSIS — E119 Type 2 diabetes mellitus without complications: Secondary | ICD-10-CM

## 2013-11-19 DIAGNOSIS — E7800 Pure hypercholesterolemia, unspecified: Secondary | ICD-10-CM

## 2013-11-19 DIAGNOSIS — IMO0002 Reserved for concepts with insufficient information to code with codable children: Secondary | ICD-10-CM

## 2013-11-19 DIAGNOSIS — F325 Major depressive disorder, single episode, in full remission: Secondary | ICD-10-CM | POA: Insufficient documentation

## 2013-11-19 DIAGNOSIS — I10 Essential (primary) hypertension: Secondary | ICD-10-CM

## 2013-11-19 MED ORDER — POLYSACCHARIDE IRON COMPLEX 150 MG PO CAPS: 150.0000 mg | ORAL_CAPSULE | Freq: Two times a day (BID) | ORAL | Status: AC

## 2013-11-19 MED ORDER — ZOLPIDEM TARTRATE ER 12.5 MG PO TBCR: 12.5000 mg | EXTENDED_RELEASE_TABLET | Freq: Every evening | ORAL | Status: AC | PRN

## 2013-11-19 NOTE — Patient Instructions (Signed)
Continue to take your current medications. Try and get 30 minutes daily of aerobic exercise. Go to [place] for back x-rays (at your convenience between 8:30-4).  I strongly encourage you to follow up with Dr. Allene Dillon not asking you to get expensive tests, but for a consult, to discuss why you have ongoing iron deficiency, and IF additional tests are needed, vs other recommendations.  Try and get the eye doctor to fax over a note stating you have no diabetic retinopathy.

## 2013-11-19 NOTE — Progress Notes (Signed)
Chief Complaint  Patient presents with  . Hypertension    nonfasting med check, labs done. Needs refills on iron, ambien and lisinopril.    Iron deficiency anemia: She has been compliant with taking iron. Other than infrequent, mild hemorrhoidal bleeding, denies any sources of bleeding.  She has not followed up with GI as recommended "I don't have that kind of money".  She has been taking the iron twice daily along with vitamin C.  She has not needed to use the hemorrhoid medication.  Diarrhea: Questran was prescribed the end of November. Previously used it with good results, but no longer using.  She currently uses imodium once daily--she finds she tolerates this much better than the Questran. It is easier to take a pill, rather than the powder, and worrying about if there is any juice left in the house to take it with.  Hypertension: Compliant with medications. Denies any headaches, dizziness. She only checks her BP at work if she is "feeling funny"--rarely, and it is normal. Denies palpitations, no chest pain.   RLS--She isn't having daily symptoms like she was at last visit (when iron was lower).  Diabetes--diet controlled. Doesn't check blood sugars. She hasn't changed her diet in any way. She doesn't drink sweet tea or soda, avoids sweets.  Doesn't eat carbs. She started using the elliptical some, but not daily. She recently had eye exam.  HH/GERD: Controlled with ranitidine 150mg  BID. Denies dysphagia. Stopped using the omeprazole 40mg  when she stopped taking the [place].  Insomnia: She works 3pm-9pm with one person, then 11pm to 7 am with another person. She works 4-5 days/week--she enjoys working this much, as it gets her out of her crazy household.  She takes the [place] on her day off, so she can get rest.  Last ambien rx'ed 6/29 #30.  She uses it about 2x/week.  It works well, denies any grogginess the next day or any unusual behaviors.  Depression/anxiety--controlled.  Husband states  she has been "really good".  She specifically mentions a great sex life.   Hyperlipidemia: Remains on atorvastatin without complaints.   She has some ongoing back pain--"I live with it".  Tylenol and Ephraim Hamburger helps.  Denies radiation of pain, numbness, tingling, weakness.  She previously saw a back doctor, and got injections which had helped.  Sometimes has some radiation to right hip, but no radiation down the leg.  Past Medical History  Diagnosis Date  . Hypertension   . Smoker     quit 11/1998  . Obesity   . GERD (gastroesophageal reflux disease)   . Anxiety associated with depression   . Dyslipidemia   . Diabetes mellitus 9/05    adult onset--diet controlled  . Anemia 2013    iron deficient  . Blood clotting disorder      ?DVT  . Diverticulosis 01/2012    L sided, seen on colonoscopy  . Hiatal hernia 01/2012    seen on EGD  . Restless leg syndrome   . Internal hemorrhoids 01/2012   Past Surgical History  Procedure Laterality Date  . Partial hysterectomy  1990  . Cholecystectomy  1987  . Cardiovascular stress test      remote past  . Bladder surgery  03/2008    bladder tack--unsuccessful per pt   . Colonoscopy  01/2012    Dr. Henrene Pastor  . Esophagogastroduodenoscopy  01/2012    Dr. Henrene Pastor   History   Social History  . Marital Status: Married    Spouse  Name: N/A    Number of Children: 2  . Years of Education: N/A   Occupational History  . 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 Smoking status: Former Smoker    Types: Cigarettes    Quit date: 02/26/2009  . Smokeless tobacco: Never Used  . Alcohol Use: Yes     Comment: 2 drinks per week.   . Drug Use: No  . Sexual Activity: Not on file   Other Topics Concern  . Not on file   Social History Narrative  . No narrative on file   Outpatient Encounter Prescriptions as of 11/19/2013  Medication Sig Note  . ANUCORT-HC 25 MG suppository INSERT 1 RECTAL SUPPOSITORY TWICE DAILY AS NEEDED FOR  HEMORRHOIDS OR ITCHING 11/19/2013: Uses prn, not using recently  . atorvastatin (LIPITOR) 10 MG tablet Take 1 tablet (10 mg total) by mouth daily.   Marland Kitchen b complex vitamins tablet Take 1 tablet by mouth daily.   Marland Kitchen BLACK COHOSH PO Take by mouth.   Marland Kitchen buPROPion (WELLBUTRIN SR) 100 MG 12 hr tablet TAKE 1 TABLET BY MOUTH TWICE DAILY. TAKE EVENING DOSE NO LATER THAN 5 PM.   . Calcium Carbonate Antacid 600 MG chewable tablet Chew 600 mg by mouth daily. 08/13/2013: Takes 1 daily (not chewable)  . cholecalciferol (VITAMIN D) 1000 UNITS tablet Take 2,000 Units by mouth daily.    . iron polysaccharides (NIFEREX) 150 MG capsule Take 1 capsule (150 mg total) by mouth 2 (two) times daily.   Marland Kitchen lisinopril (PRINIVIL,ZESTRIL) 20 MG tablet Take 1 tablet (20 mg total) by mouth daily.   Marland Kitchen loperamide (IMODIUM) 2 MG capsule Take 2 mg by mouth daily.   . Multiple Vitamins-Minerals (MULTIVITAMIN WITH MINERALS) tablet Take 1 tablet by mouth daily.   . ranitidine (ZANTAC) 150 MG/10ML syrup Take 150 mg by mouth 2 (two) times daily.   . SOY ISOFLAVONES PO Take 80 mg by mouth.   . vitamin C (ASCORBIC ACID) 500 MG tablet Take 500 mg by mouth 2 (two) times daily.   Marland Kitchen zolpidem (AMBIEN CR) 12.5 MG CR tablet Take 1 tablet (12.5 mg total) by mouth at bedtime as needed for sleep.   . [DISCONTINUED] iron polysaccharides (NIFEREX) 150 MG capsule Take 1 capsule (150 mg total) by mouth 2 (two) times daily. 11/19/2013: Takes twice daily along with Vitamin C  . [DISCONTINUED] zolpidem (AMBIEN CR) 12.5 MG CR tablet Take 1 tablet (12.5 mg total) by mouth at bedtime as needed for sleep.   . cholestyramine light (PREVALITE) 4 G packet Take 1 packet (4 g total) by mouth 2 (two) times daily. 11/19/2013: Stopped taking--using imodium instead  . etodolac (LODINE) 500 MG tablet Take 1 tablet (500 mg total) by mouth 2 (two) times daily as needed (pain). 11/19/2013: Stopped--bothered her stomach  . traMADol (ULTRAM) 50 MG tablet TAKE 1 TO 2 TABLETS BY MOUTH  EVERY 6 HOURS AS NEEDED 11/19/2013: Uses rarely, if needed for back pain  . [DISCONTINUED] ANUCORT-HC 25 MG suppository INSERT 1 RECTAL SUPPOSITORY TWICE DAILY AS NEEDED FOR HEMORRHOIDS OR ITCHING   . [DISCONTINUED] Chlorpheniramine Maleate (ALLERGY PO) Take 1 tablet by mouth daily. 08/13/2013: Uses prn  . [DISCONTINUED] FLUoxetine (PROZAC) 40 MG capsule Take 1 capsule (40 mg total) by mouth daily.   . [DISCONTINUED] omeprazole (PRILOSEC) 40 MG capsule Take 1 capsule (40 mg total) by mouth daily. 11/19/2013: Stopped taking when she stopped taking NSAIDs   Allergies  Allergen  Reactions  . Codeine Other (See Comments)    spacey  . Sulfa Antibiotics Itching   ROS:  Denies fevers, chills, URI or allergy symptoms, cough, shortness of breath, chest pain, headaches, edema, skin rashes, lesions. No nausea, vomiting.  +diarrhea, controlled by daily imodium.  No abdominal pain, urinary complaints.  Denies nose bleeds.  Only rare hemorrhoidal bleeding.  See HPI  PHYSICAL EXAM: BP 120/86  Pulse 72  Ht 5\' 2"  (1.575 m)  Wt 183 lb (83.008 kg)  BMI 33.46 kg/m2  LMP 03/29/1989  Well developed, pleasant female in no distress.  In good spirits, accompanied by her husband. HEENT:  PERRL, EOMI, conjunctiva clear.  OP clear Neck: no lymphadenopathy, thyromegaly or mass Heart: regular rate and rhythm Lungs: clear bilaterally Back: no spinal tenderness. No CVA tenderness or SI joint tenderness. No muscle spasm Abdomen: soft, nontender, no organomegaly or mass Extremities: no edema, 2+ pulse Skin: no rashes, lesions Psych: normal mood, affect, hygiene and grooming Neuro: alert and oriented.  Normal strength, sensation.  DTR's 2+ and symmetric. Negative SLR.  Lab Results  Component Value Date   HGBA1C 5.9* 11/09/2013   Lab Results  Component Value Date   WBC 6.4 11/09/2013   HGB 12.7 11/09/2013   HCT 38.7 11/09/2013   MCV 81.8 11/09/2013   PLT 328 11/09/2013   Lab Results  Component Value Date   CHOL  166 11/09/2013   HDL 48 11/09/2013   LDLCALC 90 11/09/2013   TRIG 140 11/09/2013   CHOLHDL 3.5 11/09/2013     Chemistry      Component Value Date/Time   NA 139 11/09/2013 0918   K 4.3 11/09/2013 0918   CL 103 11/09/2013 0918   CO2 25 11/09/2013 0918   BUN 12 11/09/2013 0918   CREATININE 0.65 11/09/2013 0918   CREATININE 0.68 04/16/2008 0700      Component Value Date/Time   CALCIUM 8.8 11/09/2013 0918   ALKPHOS 47 11/09/2013 0918   AST 15 11/09/2013 0918   ALT 17 11/09/2013 0918   BILITOT 0.3 11/09/2013 0918     Glucose 109 (fasting)  Lab Results  Component Value Date   IRON 28* 11/09/2013   TIBC 514* 09/22/2012   FERRITIN 34 11/09/2013   (ferritin up from 9 in May: Hgb up to 12.7 from 11.6)  Lab Results  Component Value Date   TSH 0.506 11/09/2013   Urine microalbumin/Cr 20.6  ASSESSMENT/PLAN:  Iron deficiency anemia, unspecified - improving with iron supplements, but cause is still unknown.  Again recommended to f/u with GI - Plan: iron polysaccharides (NIFEREX) 150 MG capsule, CBC with Differential, Iron, Ferritin  Need for prophylactic vaccination and inoculation against influenza - Plan: Flu Vaccine QUAD 36+ mos PF IM (Fluarix Quad PF)  Essential hypertension, benign - well controlled  Type II or unspecified type diabetes mellitus without mention of complication, not stated as uncontrolled - diet controlled.  Encouraged daily exercise and weight loss  Chronic depression - well controlled  Gastroesophageal reflux disease, esophagitis presence not specified - controlled  Pure hypercholesterolemia - at goal  Bilateral low back pain without sciatica - weight loss, core strengthening, stretching and NSAIDs prn. f/u if worsening, or radicular symptoms, weakness develops.  Tolerable for now - Plan: DG Lumbar Spine Complete  Chronic insomnia - Doing well with ambien CR on her days off, to ensure that she catches up on sleep - Plan: zolpidem (AMBIEN CR) 12.5 MG CR  tablet  Depression, major, in remission  Lab  results were reviewed and explained in detail.  Back pain--DDx reviewed. Given new/recurrent anemia without cause, and no recent imaging, will send for x-ray.  Iron deficiency anemia--recurrent, with negative UGI and colonoscopy.  She has been counseled at length at previous visits re: reasons to see GI, but she has refused to go.  This time, she states not wanting expensive tests as her reason.  I discussed that I am simply asking for a consult--if expensive tests are recommended, the reasons for them (risks/benefits) will be discussed by GI, and she can then make her decision.  But to at least make the appt for consult/discussion for what next step would be.  Explained in detail that we are replacing her iron, and anemia is improving, but we don't know where she is losing it.  She is now agreeable to call Dr. Blanch Media office and set up f/u/consult on recurrent IDA.  Lab visit 3 months--cbc and iron studies  CPE/med check in 6 months--she prefers labs prior to CPE.  Will not put orders in now so they aren't released in error at lab visit in 3 months.  She was advised to call prior to physical to have the rest of the orders entered, and have lab visit set up.  40 min visit, more than 1/2 spent counseling.

## 2013-11-30 ENCOUNTER — Ambulatory Visit
Admission: RE | Admit: 2013-11-30 | Discharge: 2013-11-30 | Disposition: A | Discharge status: Home / Self Care | Payer: No Typology Code available for payment source | Source: Ambulatory Visit | Attending: Family Medicine | Admitting: Family Medicine

## 2013-11-30 ENCOUNTER — Other Ambulatory Visit: Payer: BC Managed Care – PPO

## 2013-11-30 DIAGNOSIS — M545 Low back pain, unspecified: Secondary | ICD-10-CM

## 2014-01-11 ENCOUNTER — Other Ambulatory Visit: Payer: Self-pay | Admitting: Family Medicine

## 2014-01-15 ENCOUNTER — Encounter: Payer: Self-pay | Admitting: Medical

## 2014-01-15 ENCOUNTER — Ambulatory Visit (INDEPENDENT_AMBULATORY_CARE_PROVIDER_SITE_OTHER): Payer: No Typology Code available for payment source | Admitting: Medical

## 2014-01-15 VITALS — BP 120/82 | HR 60 | Temp 97.6°F | Resp 14 | Wt 177.0 lb

## 2014-01-15 DIAGNOSIS — B001 Herpesviral vesicular dermatitis: Secondary | ICD-10-CM

## 2014-01-15 DIAGNOSIS — K029 Dental caries, unspecified: Secondary | ICD-10-CM

## 2014-01-15 MED ORDER — VALACYCLOVIR HCL 1 G PO TABS: ORAL_TABLET | ORAL | Status: AC

## 2014-01-15 NOTE — Progress Notes (Signed)
Subjective: Here for mouth lesions.  Normally sees Dr. Tomi Bamberger here.  She notes 5 day hx/o mouth lesions, bottom right corner of lip and 1 on skin above upper lip. Last week before the lesions started, felt a burning and tingling sensation then crusted lesions appeared.   She notes hx/O similar 10 years ago,was called cold sores.  Doesn't recall what she was treated with, but saw a doctor here, and lesions resolved with treatment.  She denies any recent illness, no other URI symptom, no GI symptoms, no rash otherwise, no sick contacts with similar.   She has been under a lot of stress.   No other aggravating or relieving factors.  No other c/o.  Objective: Gen: wd, wn, nad HENT: sinus nonteder, TMs pearly, pharynx normal, nares normal Mouth: there is a 39mm area of erythema of the philtrum with some raised serous crusted roundish lesions and 1 single vesicle, the right lower lip laterally with fading 24mm area of erythema from similar recent cold sore lesion, bilat lower molars with obvious decay, otherwise no other lesions Neck: no lymphadenopathy, no mass, no thyromegaly, supple   Assessment: Encounter Diagnoses  Name Primary?  . Cold sore Yes  . Tooth decay     Plan: Cold sore - discussed diagnosis, treatment, preventative measure, topical OTC measures.   prescribed valacyclovir for recurrence, but advised that the medication will not help the current outbreak.   If she starts to get frequent outbreaks in the coming months, she will recheck.  Tooth decay - she has major fears with dentist.   Counseled on this, and recommended she see Dr. Donn Pierini locally, who I see and trust.   She will call them.    Discussed risks of untreated tooth decay.

## 2014-01-15 NOTE — Patient Instructions (Signed)
Dr. Jonna Coup, dentist [address], [place], [place] [address] [phone] [email or web address]    Cold Sore A cold sore (fever blister) is a skin infection caused by the herpes simplex virus (HSV-1). HSV-1 is closely related to the virus that causes genital herpes (HSV-2), but they are not the same even though both viruses can cause oral and genital infections. Cold sores are small, fluid-filled sores inside of the mouth or on the lips, gums, nose, chin, cheeks, or fingers.  The herpes simplex virus can be easily passed (contagious) to other people through close personal contact, such as kissing or sharing personal items. The virus can also spread to other parts of the body, such as the eyes or genitals. Cold sores are contagious until the sores crust over completely. They often heal within 2 weeks.  Once a person is infected, the herpes simplex virus remains permanently in the body. Therefore, there is no cure for cold sores, and they often recur when a person is tired, stressed, sick, or gets too much sun. Additional factors that can cause a recurrence include hormone changes in menstruation or pregnancy, certain drugs, and cold weather.  CAUSES  Cold sores are caused by the herpes simplex virus. The virus is spread from person to person through close contact, such as through kissing, touching the affected area, or sharing personal items such as lip balm, razors, or eating utensils.  SYMPTOMS  The first infection may not cause symptoms. If symptoms develop, the symptoms often go through different stages. Here is how a cold sore develops:   Tingling, itching, or burning is felt 1-2 days before the outbreak.   Fluid-filled blisters appear on the lips, inside the mouth, nose, or on the cheeks.   The blisters start to ooze clear fluid.   The blisters dry up and a yellow crust appears in its place.   The crust falls off.  Symptoms depend on whether it is the initial outbreak or a  recurrence. Some other symptoms with the first outbreak may include:   Fever.   Sore throat.   Headache.   Muscle aches.   Swollen neck glands.  DIAGNOSIS  A diagnosis is often made based on your symptoms and looking at the sores. Sometimes, a sore may be swabbed and then examined in the lab to make a final diagnosis. If the sores are not present, blood tests can find the herpes simplex virus.  TREATMENT  There is no cure for cold sores and no vaccine for the herpes simplex virus. Within 2 weeks, most cold sores go away on their own without treatment. Medicines cannot make the infection go away, but medicine can help relieve some of the pain associated with the sores, can work to stop the virus from multiplying, and can also shorten healing time. Medicine may be in the form of creams, gels, pills, or a shot.  HOME CARE INSTRUCTIONS   Only take over-the-counter or prescription medicines for pain, discomfort, or fever as directed by your caregiver. Do not use aspirin.   Use a cotton-tip swab to apply creams or gels to your sores.   Do not touch the sores or pick the scabs. Wash your hands often. Do not touch your eyes without washing your hands first.   Avoid kissing, oral sex, and sharing personal items until sores heal.   Apply an ice pack on your sores for 10-15 minutes to ease any discomfort.   Avoid hot, cold, or salty foods because they may hurt your  mouth. Eat a soft, bland diet to avoid irritating the sores. Use a straw to drink if you have pain when drinking out of a glass.   Keep sores clean and dry to prevent an infection of other tissues.   Avoid the sun and limit stress if these things trigger outbreaks. If sun causes cold sores, apply sunscreen on the lips before being out in the sun.  SEEK MEDICAL CARE IF:   You have a fever or persistent symptoms for more than 2-3 days.   You have a fever and your symptoms suddenly get worse.   You have pus, not  clear fluid, coming from the sores.   You have redness that is spreading.   You have pain or irritation in your eye.   You get sores on your genitals.   Your sores do not heal within 2 weeks.   You have a weakened immune system.   You have frequent recurrences of cold sores.  MAKE SURE YOU:   Understand these instructions.  Will watch your condition.  Will get help right away if you are not doing well or get worse. Document Released: 03/12/2000 Document Revised: 07/30/2013 Document Reviewed: 07/28/2011 [facility] Patient Information 2015 [place], [place]. This information is not intended to replace advice given to you by your health care provider. Make sure you discuss any questions you have with your health care provider.

## 2014-01-16 ENCOUNTER — Telehealth: Payer: Self-pay | Admitting: Family Medicine

## 2014-01-16 NOTE — Telephone Encounter (Signed)
Husband called and states cold sores are now in patients mouth.  Can you please call her in something for sores inside her mouth to [facility] on [address].

## 2014-01-16 NOTE — Telephone Encounter (Signed)
Have her start the medication I prescribed, Valacyclovir

## 2014-01-17 NOTE — Telephone Encounter (Signed)
Reported to husband. Wife is at work.

## 2014-01-28 ENCOUNTER — Encounter: Payer: Self-pay | Admitting: Medical

## 2014-02-04 ENCOUNTER — Other Ambulatory Visit: Payer: Self-pay | Admitting: Medical

## 2014-02-04 ENCOUNTER — Telehealth: Payer: Self-pay | Admitting: Medical

## 2014-02-04 MED ORDER — VALACYCLOVIR HCL 500 MG PO TABS: 500.0000 mg | ORAL_TABLET | Freq: Every day | ORAL | Status: AC

## 2014-02-04 NOTE — Telephone Encounter (Signed)
I resent Valtrex 500mg  for once daily use for suppression.  So with the Valtrex she has left, have her do acute flare up therapy, 2000mg  (1g x 2 tablets) BID x 1 days, then begin the 500mg  once daily dose.   Lets give this a month or 2 to see if it helps suppress future outbreaks

## 2014-02-04 NOTE — Telephone Encounter (Signed)
LMOM TO CB. CLS 

## 2014-02-05 NOTE — Telephone Encounter (Signed)
LMOM TO CB. CLS 

## 2014-02-08 ENCOUNTER — Other Ambulatory Visit: Payer: No Typology Code available for payment source

## 2014-02-08 NOTE — Telephone Encounter (Signed)
I left a detailed message on her voicemail.

## 2014-02-24 ENCOUNTER — Other Ambulatory Visit: Payer: Self-pay | Admitting: Family Medicine

## 2014-03-26 ENCOUNTER — Telehealth: Payer: Self-pay | Admitting: Family Medicine

## 2014-03-26 NOTE — Telephone Encounter (Signed)
Pt's husband called to reschedule wife's 1/7 Med Check with Dr Tomi Bamberger. Said after scheduling the 1/7 appointment previously, they are aware that Dr Tomi Bamberger is not here on 1/5 but they want to schedule an afternoon Med Check with Audelia Acton this time because pt is going out of town and Beatris Ship is the only day that will work for them. Tried to get them to schedule for another day (i.e. Mon 1/4 , another week, another time, etc) so that she could still schedule with Dr Tomi Bamberger. Is this ok to schedule with Audelia Acton?

## 2014-03-26 NOTE — Telephone Encounter (Signed)
Chart reviewed.  She was due for lab visit in November (future orders are in system)--this was just following up on anemia.  Med check/CPE isn't due until the end of February (6 months from her last visit, which was in August).  She has appt scheduled with me for March--can she still keep that appt?  I don't think she needs OV now, just labs (nonfasting). It is not fair to Natasha Owens to see my patients for chronic issues (he has only seen her once for acute visit)

## 2014-03-26 NOTE — Telephone Encounter (Signed)
Called pt to get some clarification on why 04/02/14 appointment is needed. It appear that the patient may have requested this appointment. No answer an no machine to leave message. Will call again

## 2014-03-27 NOTE — Telephone Encounter (Signed)
Spoke to husband and he states wife thought she needed labs and an appointment to discuss meds before going out of towns for a few months since she missed her last appointment. Advised that Dr Tomi Bamberger was OK with getting just labs for now and pt keeping March appointment so they cancelled 04/02/14 appointment.

## 2014-03-28 ENCOUNTER — Other Ambulatory Visit: Payer: No Typology Code available for payment source

## 2014-04-02 ENCOUNTER — Other Ambulatory Visit: Payer: No Typology Code available for payment source

## 2014-04-02 ENCOUNTER — Encounter: Payer: No Typology Code available for payment source | Admitting: Medical

## 2014-04-02 DIAGNOSIS — D509 Iron deficiency anemia, unspecified: Secondary | ICD-10-CM

## 2014-04-02 LAB — CBC WITH DIFFERENTIAL/PLATELET
Basophils Absolute: 0.1 10*3/uL (ref 0.0–0.1)
Basophils Relative: 1 % (ref 0–1)
Eosinophils Absolute: 0.5 10*3/uL (ref 0.0–0.7)
Eosinophils Relative: 7 % — ABNORMAL HIGH (ref 0–5)
HCT: 36 % (ref 36.0–46.0)
Hemoglobin: 12.1 g/dL (ref 12.0–15.0)
LYMPHS ABS: 1.5 10*3/uL (ref 0.7–4.0)
LYMPHS PCT: 19 % (ref 12–46)
MCH: 28.5 pg (ref 26.0–34.0)
MCHC: 33.6 g/dL (ref 30.0–36.0)
MCV: 84.7 fL (ref 78.0–100.0)
MONO ABS: 0.8 10*3/uL (ref 0.1–1.0)
MONOS PCT: 10 % (ref 3–12)
MPV: 8.4 fL — ABNORMAL LOW (ref 8.6–12.4)
NEUTROS ABS: 4.9 10*3/uL (ref 1.7–7.7)
NEUTROS PCT: 63 % (ref 43–77)
Platelets: 405 10*3/uL — ABNORMAL HIGH (ref 150–400)
RBC: 4.25 MIL/uL (ref 3.87–5.11)
RDW: 15.6 % — AB (ref 11.5–15.5)
WBC: 7.7 10*3/uL (ref 4.0–10.5)

## 2014-04-02 LAB — FERRITIN: FERRITIN: 80 ng/mL (ref 10–291)

## 2014-04-02 LAB — IRON: Iron: 75 ug/dL (ref 42–145)

## 2014-04-04 ENCOUNTER — Encounter: Payer: No Typology Code available for payment source | Admitting: Family Medicine

## 2014-05-29 ENCOUNTER — Encounter: Payer: No Typology Code available for payment source | Admitting: Family Medicine

## 2014-05-29 DIAGNOSIS — Z0000 Encounter for general adult medical examination without abnormal findings: Secondary | ICD-10-CM

## 2014-06-20 ENCOUNTER — Telehealth: Payer: Self-pay | Admitting: Internal Medicine

## 2014-06-20 DIAGNOSIS — E7800 Pure hypercholesterolemia, unspecified: Secondary | ICD-10-CM

## 2014-06-20 MED ORDER — ATORVASTATIN CALCIUM 10 MG PO TABS: 10.0000 mg | ORAL_TABLET | Freq: Every day | ORAL | Status: AC

## 2014-06-20 NOTE — Telephone Encounter (Signed)
Spoke to Natasha Owens, and notified him i could only refill it for one month and she will have to find a doctor sooner

## 2014-06-20 NOTE — Telephone Encounter (Signed)
No. This is not okay. Her last lipids were in AUGUST 2015, and she recently no-showed her visit. She is past due for labs now.  I will not refill her medications until October. I can refill x 1 month to give her time to find a new doctor

## 2014-06-20 NOTE — Telephone Encounter (Signed)
Natasha Owens called for pt stating that she is up in [place] and will be living there permeantly but not see a Doctor up there til around October and needing atorvastatin 10mg  refilled for about 7 months til she can see a doctor. Is this okay to refill. Send to [facility] in [place]

## 2014-06-20 NOTE — Telephone Encounter (Signed)
done

## 2014-06-25 ENCOUNTER — Encounter: Payer: Self-pay | Admitting: Family Medicine

## 2015-03-02 IMAGING — CR DG LUMBAR SPINE COMPLETE 4+V
5 series · 5 of 5 positions shown · non-contrast
Comparison: None.

CLINICAL DATA: 53-year-old female with low back pain radiating to
the right hip. Initial encounter. No known injury.

EXAM:
LUMBAR SPINE - COMPLETE 4+ VIEW

[view not recorded (1 of 5)]
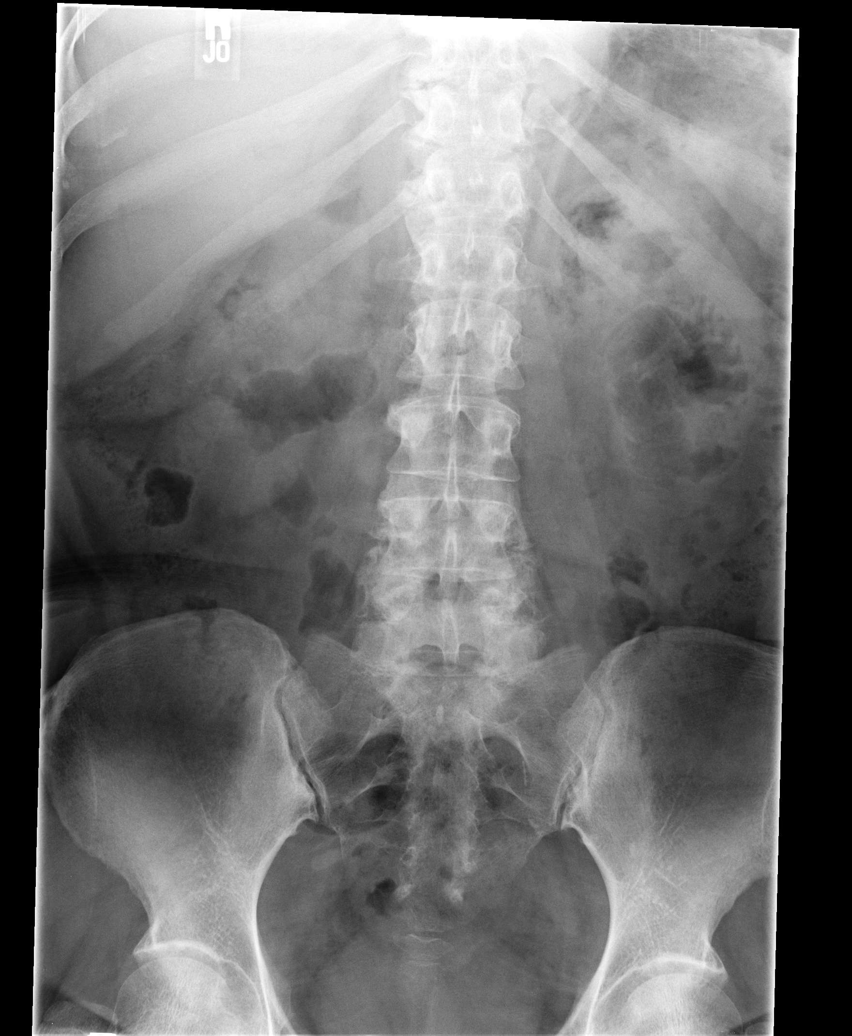

[view not recorded (2 of 5)]
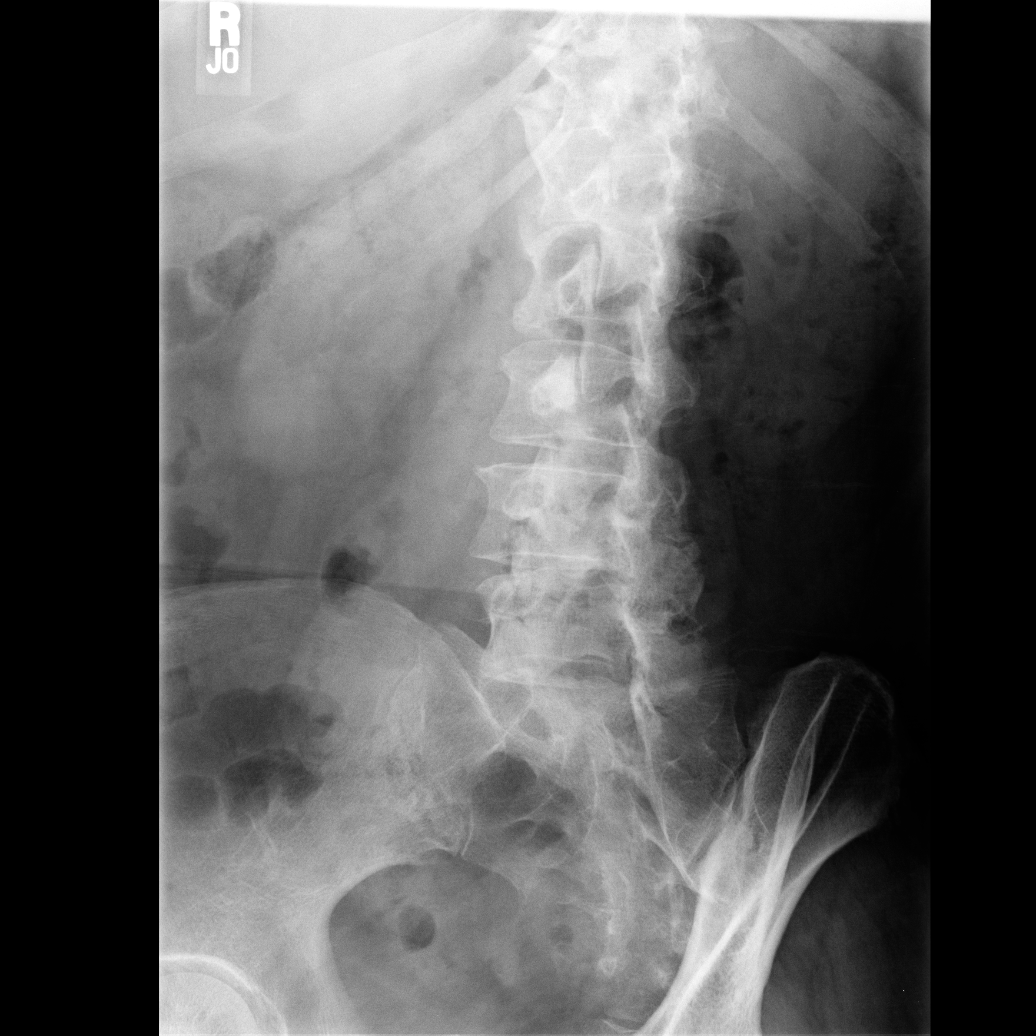

[view not recorded (3 of 5)]
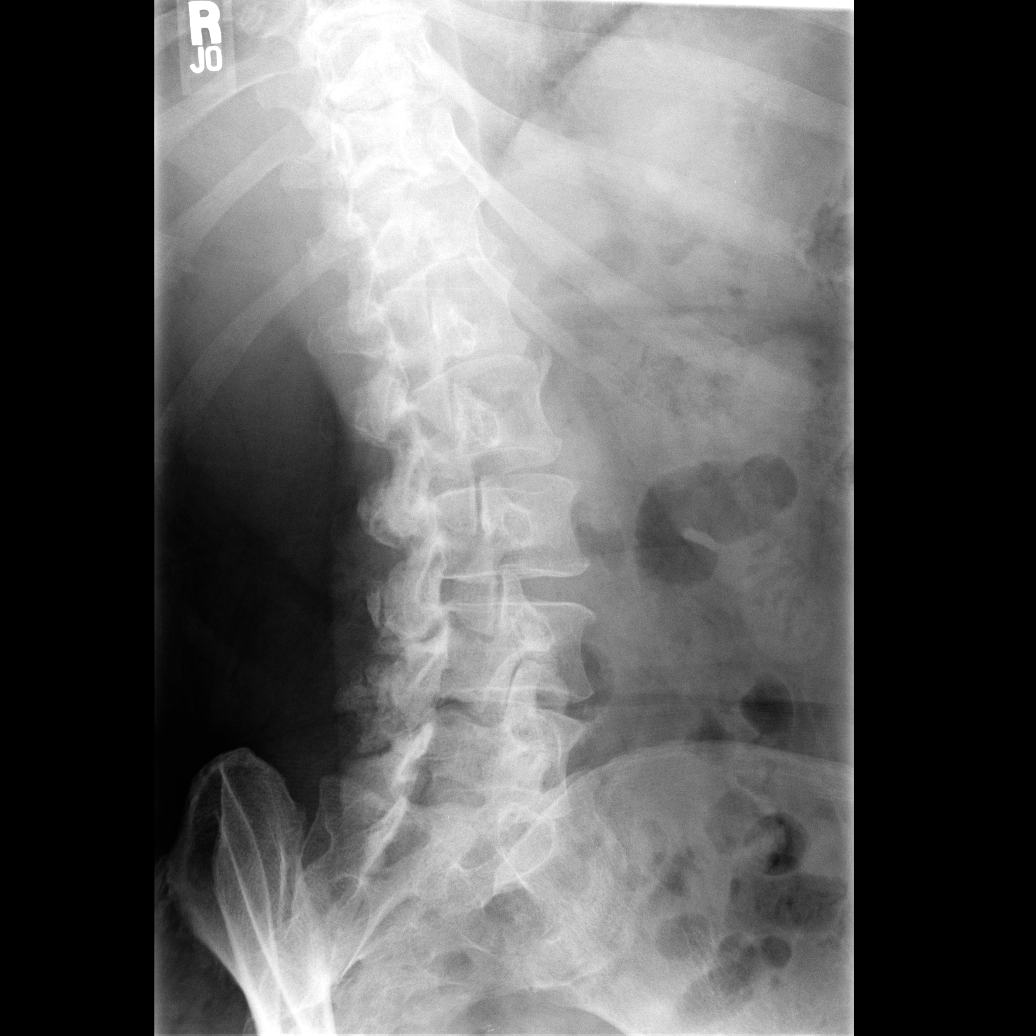

[view not recorded (4 of 5)]
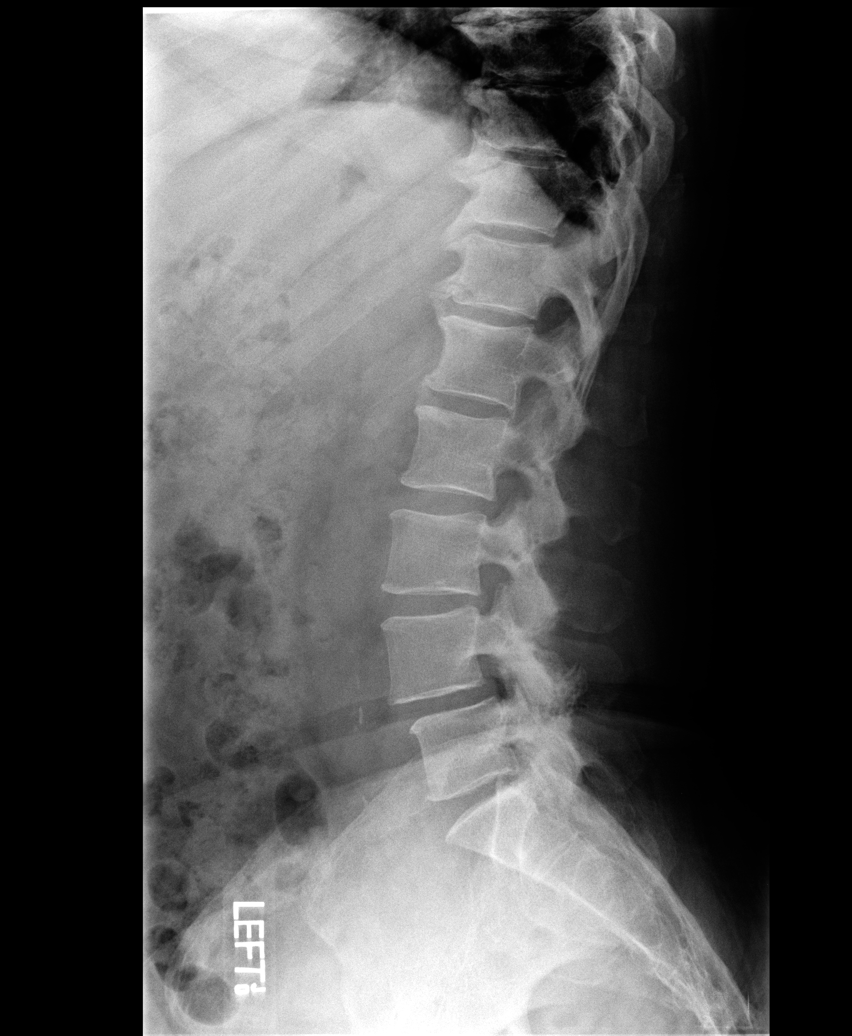

[view not recorded (5 of 5)]
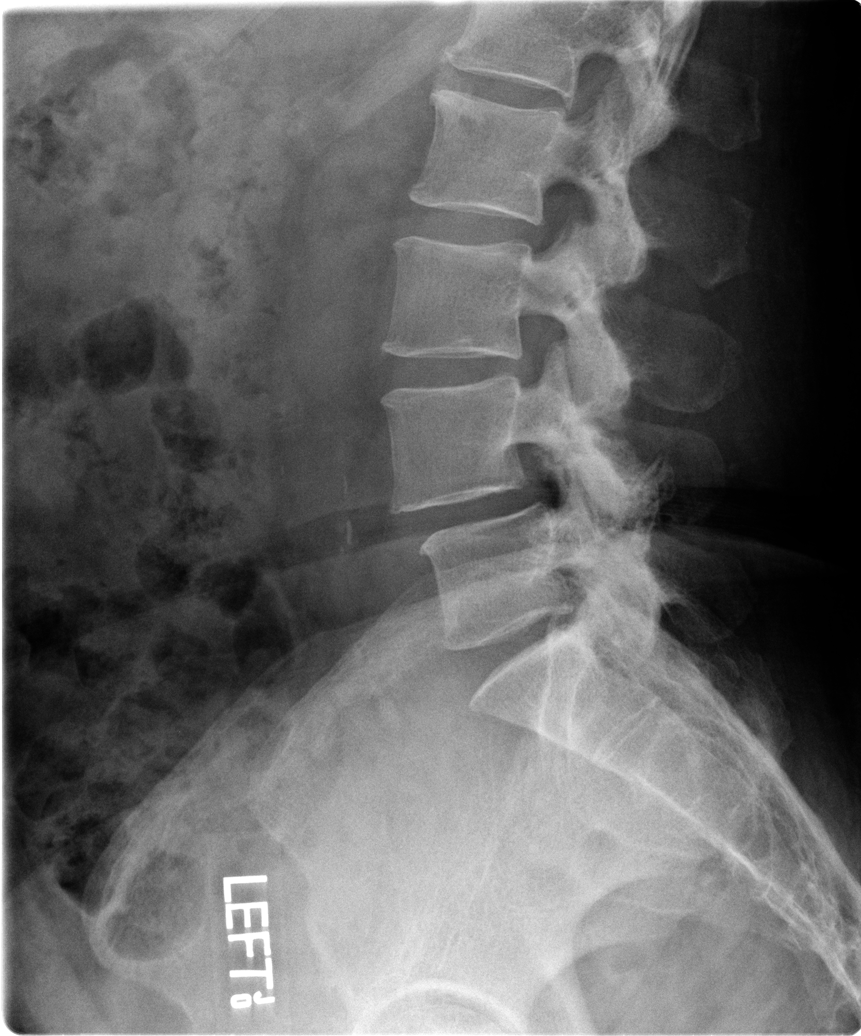

[5 of 5 positions shown; findings below may reference images not displayed]

FINDINGS: Normal lumbar segmentation. Mild grade 1 anterolisthesis of L4 on
L5, measuring about 3-4 mm. Otherwise normal height and alignment.
Preserved disc spaces, but there is severe L4-L5 and L5-S1 facet
hypertrophy. No pars fracture. Visible lower thoracic levels appear
intact with bulky anterior endplate spurring. sacral ala and SI
joints within normal limits.
IMPRESSION: 1. L4-L5 grade 1 anterolisthesis, associated with severe L4-L5 and
L5-S1 facet arthropathy.
2. Flowing osteophytes in the visible lower thoracic spine
suggestive of diffuse idiopathic skeletal hyperostosis.

## 2015-04-29 ENCOUNTER — Telehealth: Payer: Self-pay | Admitting: Family Medicine

## 2015-04-29 NOTE — Telephone Encounter (Signed)
Left a message for pt to call concerning medical records request received. Please verify pt is transferring out and we need to collect $10 records fee.

## 2015-04-30 DIAGNOSIS — Z0279 Encounter for issue of other medical certificate: Secondary | ICD-10-CM

## 2015-04-30 NOTE — Telephone Encounter (Signed)
Received payment records mailed to [facility] [address] [place] [address]

## 2018-07-20 ENCOUNTER — Encounter: Payer: Self-pay | Admitting: Family Medicine

## 2022-01-15 ENCOUNTER — Encounter: Payer: Self-pay | Admitting: Internal Medicine
